# Patient Record
Sex: Female | Born: 1980
Health system: Southern US, Community
[De-identification: ages and names within clinical notes are randomized; demographics above are authoritative.]

## PROBLEM LIST (undated history)

## (undated) DIAGNOSIS — L409 Psoriasis, unspecified: Secondary | ICD-10-CM

## (undated) DIAGNOSIS — Z8679 Personal history of other diseases of the circulatory system: Secondary | ICD-10-CM

## (undated) DIAGNOSIS — N946 Dysmenorrhea, unspecified: Secondary | ICD-10-CM

## (undated) DIAGNOSIS — J302 Other seasonal allergic rhinitis: Secondary | ICD-10-CM

## (undated) HISTORY — PX: DENTAL SURGERY: SHX609

## (undated) HISTORY — DX: Other seasonal allergic rhinitis: J30.2

## (undated) HISTORY — PX: CHOLECYSTECTOMY: SHX55

## (undated) HISTORY — DX: Personal history of other diseases of the circulatory system: Z86.79

## (undated) HISTORY — PX: LAPAROSCOPY: SHX197

## (undated) HISTORY — DX: Dysmenorrhea, unspecified: N94.6

## (undated) HISTORY — PX: TONSILLECTOMY: SUR1361

## (undated) HISTORY — DX: Psoriasis, unspecified: L40.9

---

## 2005-04-08 ENCOUNTER — Emergency Department: Payer: Self-pay | Admitting: Emergency Medicine

## 2006-06-15 ENCOUNTER — Ambulatory Visit: Payer: Self-pay | Admitting: Obstetrics and Gynecology

## 2006-09-26 ENCOUNTER — Ambulatory Visit: Payer: Self-pay | Admitting: General Practice

## 2006-10-27 ENCOUNTER — Ambulatory Visit: Payer: Self-pay | Admitting: General Surgery

## 2006-11-07 ENCOUNTER — Ambulatory Visit: Payer: Self-pay | Admitting: General Surgery

## 2006-11-09 ENCOUNTER — Observation Stay: Payer: Self-pay | Admitting: General Surgery

## 2007-09-03 ENCOUNTER — Encounter: Payer: Self-pay | Admitting: Maternal and Fetal Medicine

## 2007-09-10 ENCOUNTER — Encounter: Payer: Self-pay | Admitting: Maternal and Fetal Medicine

## 2007-10-12 ENCOUNTER — Observation Stay: Payer: Self-pay

## 2007-10-19 ENCOUNTER — Ambulatory Visit: Payer: Self-pay | Admitting: Obstetrics and Gynecology

## 2007-11-11 ENCOUNTER — Observation Stay: Payer: Self-pay | Admitting: Obstetrics and Gynecology

## 2007-12-05 ENCOUNTER — Observation Stay: Payer: Self-pay

## 2007-12-14 ENCOUNTER — Ambulatory Visit: Payer: Self-pay | Admitting: Unknown Physician Specialty

## 2007-12-24 ENCOUNTER — Observation Stay: Payer: Self-pay

## 2007-12-27 HISTORY — PX: TUBAL LIGATION: SHX77

## 2007-12-28 ENCOUNTER — Observation Stay: Payer: Self-pay | Admitting: Obstetrics & Gynecology

## 2007-12-30 ENCOUNTER — Observation Stay: Payer: Self-pay

## 2008-01-21 ENCOUNTER — Inpatient Hospital Stay: Payer: Self-pay

## 2013-02-28 ENCOUNTER — Emergency Department: Payer: Self-pay | Admitting: Emergency Medicine

## 2015-12-14 ENCOUNTER — Other Ambulatory Visit: Payer: Self-pay | Admitting: Internal Medicine

## 2015-12-14 DIAGNOSIS — M5489 Other dorsalgia: Secondary | ICD-10-CM

## 2016-01-01 ENCOUNTER — Ambulatory Visit: Admission: RE | Admit: 2016-01-01 | Payer: Self-pay | Source: Ambulatory Visit

## 2016-02-18 ENCOUNTER — Other Ambulatory Visit: Payer: Self-pay | Admitting: Internal Medicine

## 2016-02-18 DIAGNOSIS — M549 Dorsalgia, unspecified: Secondary | ICD-10-CM

## 2016-03-09 ENCOUNTER — Ambulatory Visit: Payer: Commercial Managed Care - HMO | Attending: Internal Medicine

## 2016-05-24 ENCOUNTER — Encounter: Payer: Self-pay | Admitting: Medical Oncology

## 2016-05-24 ENCOUNTER — Emergency Department
Admission: EM | Admit: 2016-05-24 | Discharge: 2016-05-24 | Disposition: A | Discharge status: Home / Self Care | Payer: Commercial Managed Care - HMO | Attending: Emergency Medicine | Admitting: Emergency Medicine

## 2016-05-24 ENCOUNTER — Emergency Department: Payer: Commercial Managed Care - HMO

## 2016-05-24 DIAGNOSIS — Y9389 Activity, other specified: Secondary | ICD-10-CM | POA: Insufficient documentation

## 2016-05-24 DIAGNOSIS — S161XXA Strain of muscle, fascia and tendon at neck level, initial encounter: Secondary | ICD-10-CM | POA: Diagnosis not present

## 2016-05-24 DIAGNOSIS — S29012A Strain of muscle and tendon of back wall of thorax, initial encounter: Secondary | ICD-10-CM | POA: Diagnosis not present

## 2016-05-24 DIAGNOSIS — Y999 Unspecified external cause status: Secondary | ICD-10-CM | POA: Diagnosis not present

## 2016-05-24 DIAGNOSIS — Y92410 Unspecified street and highway as the place of occurrence of the external cause: Secondary | ICD-10-CM | POA: Diagnosis not present

## 2016-05-24 DIAGNOSIS — S46911A Strain of unspecified muscle, fascia and tendon at shoulder and upper arm level, right arm, initial encounter: Secondary | ICD-10-CM | POA: Diagnosis not present

## 2016-05-24 DIAGNOSIS — S39012A Strain of muscle, fascia and tendon of lower back, initial encounter: Secondary | ICD-10-CM

## 2016-05-24 DIAGNOSIS — S199XXA Unspecified injury of neck, initial encounter: Secondary | ICD-10-CM | POA: Diagnosis present

## 2016-05-24 MED ORDER — KETOROLAC TROMETHAMINE 60 MG/2ML IM SOLN
60.0000 mg | Freq: Once | INTRAMUSCULAR | Status: AC
  Administered 2016-05-24: 60 mg via INTRAMUSCULAR

## 2016-05-24 MED ORDER — IBUPROFEN 800 MG PO TABS: 800.0000 mg | ORAL_TABLET | Freq: Three times a day (TID) | ORAL | Status: DC | PRN

## 2016-05-24 MED ORDER — CYCLOBENZAPRINE HCL 10 MG PO TABS
ORAL_TABLET | ORAL | Status: AC
  Filled 2016-05-24: qty 1

## 2016-05-24 MED ORDER — KETOROLAC TROMETHAMINE 30 MG/ML IJ SOLN
INTRAMUSCULAR | Status: AC
  Filled 2016-05-24: qty 1

## 2016-05-24 MED ORDER — CYCLOBENZAPRINE HCL 10 MG PO TABS: 10.0000 mg | ORAL_TABLET | Freq: Three times a day (TID) | ORAL | Status: DC | PRN

## 2016-05-24 MED ORDER — CYCLOBENZAPRINE HCL 10 MG PO TABS
5.0000 mg | ORAL_TABLET | Freq: Once | ORAL | Status: AC
  Administered 2016-05-24: 5 mg via ORAL
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
## 2016-05-24 NOTE — Discharge Instructions (Signed)
Cervical Sprain °A cervical sprain is an injury in the neck in which the strong, fibrous tissues (ligaments) that connect your neck bones stretch or tear. Cervical sprains can range from mild to severe. Severe cervical sprains can cause the neck vertebrae to be unstable. This can lead to damage of the spinal cord and can result in serious nervous system problems. The amount of time it takes for a cervical sprain to get better depends on the cause and extent of the injury. Most cervical sprains heal in 1 to 3 weeks. °CAUSES  °Severe cervical sprains may be caused by:  °· Contact sport injuries (such as from football, rugby, wrestling, hockey, auto racing, gymnastics, diving, martial arts, or boxing).   °· Motor vehicle collisions.   °· Whiplash injuries. This is an injury from a sudden forward and backward whipping movement of the head and neck.  °· Falls.   °Mild cervical sprains may be caused by:  °· Being in an awkward position, such as while cradling a telephone between your ear and shoulder.   °· Sitting in a chair that does not offer proper support.   °· Working at a poorly designed computer station.   °· Looking up or down for long periods of time.   °SYMPTOMS  °· Pain, soreness, stiffness, or a burning sensation in the front, back, or sides of the neck. This discomfort may develop immediately after the injury or slowly, 24 hours or more after the injury.   °· Pain or tenderness directly in the middle of the back of the neck.   °· Shoulder or upper back pain.   °· Limited ability to move the neck.   °· Headache.   °· Dizziness.   °· Weakness, numbness, or tingling in the hands or arms.   °· Muscle spasms.   °· Difficulty swallowing or chewing.   °· Tenderness and swelling of the neck.   °DIAGNOSIS  °Most of the time your health care provider can diagnose a cervical sprain by taking your history and doing a physical exam. Your health care provider will ask about previous neck injuries and any known neck  problems, such as arthritis in the neck. X-rays may be taken to find out if there are any other problems, such as with the bones of the neck. Other tests, such as a CT scan or MRI, may also be needed.  °TREATMENT  °Treatment depends on the severity of the cervical sprain. Mild sprains can be treated with rest, keeping the neck in place (immobilization), and pain medicines. Severe cervical sprains are immediately immobilized. Further treatment is done to help with pain, muscle spasms, and other symptoms and may include: °· Medicines, such as pain relievers, numbing medicines, or muscle relaxants.   °· Physical therapy. This may involve stretching exercises, strengthening exercises, and posture training. Exercises and improved posture can help stabilize the neck, strengthen muscles, and help stop symptoms from returning.   °HOME CARE INSTRUCTIONS  °· Put ice on the injured area.   °· Put ice in a plastic bag.   °· Place a towel between your skin and the bag.   °· Leave the ice on for 15-20 minutes, 3-4 times a day.   °· If your injury was severe, you may have been given a cervical collar to wear. A cervical collar is a two-piece collar designed to keep your neck from moving while it heals. °· Do not remove the collar unless instructed by your health care provider. °· If you have long hair, keep it outside of the collar. °· Ask your health care provider before making any adjustments to your collar. Minor   adjustments may be required over time to improve comfort and reduce pressure on your chin or on the back of your head. °· If you are allowed to remove the collar for cleaning or bathing, follow your health care provider's instructions on how to do so safely. °· Keep your collar clean by wiping it with mild soap and water and drying it completely. If the collar you have been given includes removable pads, remove them every 1-2 days and hand wash them with soap and water. Allow them to air dry. They should be completely  dry before you wear them in the collar. °· If you are allowed to remove the collar for cleaning and bathing, wash and dry the skin of your neck. Check your skin for irritation or sores. If you see any, tell your health care provider. °· Do not drive while wearing the collar.   °· Only take over-the-counter or prescription medicines for pain, discomfort, or fever as directed by your health care provider.   °· Keep all follow-up appointments as directed by your health care provider.   °· Keep all physical therapy appointments as directed by your health care provider.   °· Make any needed adjustments to your workstation to promote good posture.   °· Avoid positions and activities that make your symptoms worse.   °· Warm up and stretch before being active to help prevent problems.   °SEEK MEDICAL CARE IF:  °· Your pain is not controlled with medicine.   °· You are unable to decrease your pain medicine over time as planned.   °· Your activity level is not improving as expected.   °SEEK IMMEDIATE MEDICAL CARE IF:  °· You develop any bleeding. °· You develop stomach upset. °· You have signs of an allergic reaction to your medicine.   °· Your symptoms get worse.   °· You develop new, unexplained symptoms.   °· You have numbness, tingling, weakness, or paralysis in any part of your body.   °MAKE SURE YOU:  °· Understand these instructions. °· Will watch your condition. °· Will get help right away if you are not doing well or get worse. °  °This information is not intended to replace advice given to you by your health care provider. Make sure you discuss any questions you have with your health care provider. °  °Document Released: 10/09/2007 Document Revised: 12/17/2013 Document Reviewed: 06/19/2013 °Elsevier Interactive Patient Education ©2016 [employer] ° °Lumbosacral Strain °Lumbosacral strain is a strain of any of the parts that make up your lumbosacral vertebrae. Your lumbosacral vertebrae are the bones that make up  the lower third of your backbone. Your lumbosacral vertebrae are held together by muscles and tough, fibrous tissue (ligaments).  °CAUSES  °A sudden blow to your back can cause lumbosacral strain. Also, anything that causes an excessive stretch of the muscles in the low back can cause this strain. This is typically seen when people exert themselves strenuously, fall, lift heavy objects, bend, or crouch repeatedly. °RISK FACTORS °· Physically demanding work. °· Participation in pushing or pulling sports or sports that require a sudden twist of the back (tennis, golf, baseball). °· Weight lifting. °· Excessive lower back curvature. °· Forward-tilted pelvis. °· Weak back or abdominal muscles or both. °· Tight hamstrings. °SIGNS AND SYMPTOMS  °Lumbosacral strain may cause pain in the area of your injury or pain that moves (radiates) down your leg.  °DIAGNOSIS °Your health care provider can often diagnose lumbosacral strain through a physical exam. In some cases, you may need tests such as X-ray exams.  °TREATMENT  °  Treatment for your lower back injury depends on many factors that your clinician will have to evaluate. However, most treatment will include the use of anti-inflammatory medicines. °HOME CARE INSTRUCTIONS  °· Avoid hard physical activities (tennis, racquetball, waterskiing) if you are not in proper physical condition for it. This may aggravate or create problems. °· If you have a back problem, avoid sports requiring sudden body movements. Swimming and walking are generally safer activities. °· Maintain good posture. °· Maintain a healthy weight. °· For acute conditions, you may put ice on the injured area. °· Put ice in a plastic bag. °· Place a towel between your skin and the bag. °· Leave the ice on for 20 minutes, 2-3 times a day. °· When the low back starts healing, stretching and strengthening exercises may be recommended. °SEEK MEDICAL CARE IF: °· Your back pain is getting worse. °· You experience  severe back pain not relieved with medicines. °SEEK IMMEDIATE MEDICAL CARE IF:  °· You have numbness, tingling, weakness, or problems with the use of your arms or legs. °· There is a change in bowel or bladder control. °· You have increasing pain in any area of the body, including your belly (abdomen). °· You notice shortness of breath, dizziness, or feel faint. °· You feel sick to your stomach (nauseous), are throwing up (vomiting), or become sweaty. °· You notice discoloration of your toes or legs, or your feet get very cold. °MAKE SURE YOU:  °· Understand these instructions. °· Will watch your condition. °· Will get help right away if you are not doing well or get worse. °  °This information is not intended to replace advice given to you by your health care provider. Make sure you discuss any questions you have with your health care provider. °  °Document Released: 09/21/2005 Document Revised: 01/02/2015 Document Reviewed: 07/31/2013 °Elsevier Interactive Patient Education ©2016 [employer] Inc[employer] ° °Motor Vehicle Collision °It is common to have multiple bruises and sore muscles after a motor vehicle collision (MVC). These tend to feel worse for the first 24 hours. You may have the most stiffness and soreness over the first several hours. You may also feel worse when you wake up the first morning after your collision. After this point, you will usually begin to improve with each day. The speed of improvement often depends on the severity of the collision, the number of injuries, and the location and nature of these injuries. °HOME CARE INSTRUCTIONS °· Put ice on the injured area. °¨ Put ice in a plastic bag. °¨ Place a towel between your skin and the bag. °¨ Leave the ice on for 15-20 minutes, 3-4 times a day, or as directed by your health care provider. °· Drink enough fluids to keep your urine clear or pale yellow. Do not drink alcohol. °· Take a warm shower or bath once or twice a day. This will increase blood flow  to sore muscles. °· You may return to activities as directed by your caregiver. Be careful when lifting, as this may aggravate neck or back pain. °· Only take over-the-counter or prescription medicines for pain, discomfort, or fever as directed by your caregiver. Do not use aspirin. This may increase bruising and bleeding. °SEEK IMMEDIATE MEDICAL CARE IF: °· You have numbness, tingling, or weakness in the arms or legs. °· You develop severe headaches not relieved with medicine. °· You have severe neck pain, especially tenderness in the middle of the back of your neck. °· You have changes   in bowel or bladder control. °· There is increasing pain in any area of the body. °· You have shortness of breath, light-headedness, dizziness, or fainting. °· You have chest pain. °· You feel sick to your stomach (nauseous), throw up (vomit), or sweat. °· You have increasing abdominal discomfort. °· There is blood in your urine, stool, or vomit. °· You have pain in your shoulder (shoulder strap areas). °· You feel your symptoms are getting worse. °MAKE SURE YOU: °· Understand these instructions. °· Will watch your condition. °· Will get help right away if you are not doing well or get worse. °  °This information is not intended to replace advice given to you by your health care provider. Make sure you discuss any questions you have with your health care provider. °  °Document Released: 12/12/2005 Document Revised: 01/02/2015 Document Reviewed: 05/11/2011 °Elsevier Interactive Patient Education ©2016 [employer]vier Inc. ° °

## 2016-06-03 ENCOUNTER — Emergency Department: Payer: Commercial Managed Care - HMO

## 2016-06-03 ENCOUNTER — Encounter: Payer: Self-pay | Admitting: Emergency Medicine

## 2016-06-03 ENCOUNTER — Emergency Department
Admission: EM | Admit: 2016-06-03 | Discharge: 2016-06-03 | Disposition: A | Discharge status: Home / Self Care | Payer: Commercial Managed Care - HMO | Attending: Emergency Medicine | Admitting: Emergency Medicine

## 2016-06-03 DIAGNOSIS — M545 Low back pain, unspecified: Secondary | ICD-10-CM

## 2016-06-03 DIAGNOSIS — Y9389 Activity, other specified: Secondary | ICD-10-CM | POA: Diagnosis not present

## 2016-06-03 DIAGNOSIS — Z87891 Personal history of nicotine dependence: Secondary | ICD-10-CM | POA: Diagnosis not present

## 2016-06-03 DIAGNOSIS — Y92410 Unspecified street and highway as the place of occurrence of the external cause: Secondary | ICD-10-CM | POA: Diagnosis not present

## 2016-06-03 DIAGNOSIS — Y999 Unspecified external cause status: Secondary | ICD-10-CM | POA: Diagnosis not present

## 2016-06-03 MED ORDER — TRAMADOL HCL 50 MG PO TABS: 50.0000 mg | ORAL_TABLET | Freq: Four times a day (QID) | ORAL | Status: DC | PRN

## 2016-06-03 MED ORDER — KETOROLAC TROMETHAMINE 60 MG/2ML IM SOLN
60.0000 mg | Freq: Once | INTRAMUSCULAR | Status: AC
  Administered 2016-06-03: 60 mg via INTRAMUSCULAR
  Filled 2016-06-03: qty 2

## 2016-06-03 MED ORDER — DIAZEPAM 5 MG/ML IJ SOLN
5.0000 mg | Freq: Once | INTRAMUSCULAR | Status: AC
  Administered 2016-06-03: 5 mg via INTRAMUSCULAR
  Filled 2016-06-03: qty 2

## 2016-06-03 MED ORDER — HYDROCODONE-ACETAMINOPHEN 5-325 MG PO TABS
1.0000 | ORAL_TABLET | Freq: Once | ORAL | Status: AC
  Administered 2016-06-03: 1 via ORAL
  Filled 2016-06-03: qty 1

## 2016-06-03 MED ORDER — MELOXICAM 15 MG PO TABS: 15.0000 mg | ORAL_TABLET | Freq: Every day | ORAL | Status: DC

## 2016-06-03 MED ORDER — DIAZEPAM 2 MG PO TABS: 2.0000 mg | ORAL_TABLET | Freq: Three times a day (TID) | ORAL | Status: DC | PRN
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
## 2016-06-03 NOTE — Discharge Instructions (Signed)
Back Pain, Adult °Back pain is very common in adults. The cause of back pain is rarely dangerous and the pain often gets better over time. The cause of your back pain may not be known. Some common causes of back pain include: °1. Strain of the muscles or ligaments supporting the spine. °2. Wear and tear (degeneration) of the spinal disks. °3. Arthritis. °4. Direct injury to the back. °For many people, back pain may return. Since back pain is rarely dangerous, most people can learn to manage this condition on their own. °HOME CARE INSTRUCTIONS °Watch your back pain for any changes. The following actions may help to lessen any discomfort you are feeling: °1. Remain active. It is stressful on your back to sit or stand in one place for long periods of time. Do not sit, drive, or stand in one place for more than 30 minutes at a time. Take short walks on even surfaces as soon as you are able. Try to increase the length of time you walk each day. °2. Exercise regularly as directed by your health care provider. Exercise helps your back heal faster. It also helps avoid future injury by keeping your muscles strong and flexible. °3. Do not stay in bed. Resting more than 1-2 days can delay your recovery. °4. Pay attention to your body when you bend and lift. The most comfortable positions are those that put less stress on your recovering back. Always use proper lifting techniques, including: °1. Bending your knees. °2. Keeping the load close to your body. °3. Avoiding twisting. °5. Find a comfortable position to sleep. Use a firm mattress and lie on your side with your knees slightly bent. If you lie on your back, put a pillow under your knees. °6. Avoid feeling anxious or stressed. Stress increases muscle tension and can worsen back pain. It is important to recognize when you are anxious or stressed and learn ways to manage it, such as with exercise. °7. Take medicines only as directed by your health care provider.  Over-the-counter medicines to reduce pain and inflammation are often the most helpful. Your health care provider may prescribe muscle relaxant drugs. These medicines help dull your pain so you can more quickly return to your normal activities and healthy exercise. °8. Apply ice to the injured area: °1. Put ice in a plastic bag. °2. Place a towel between your skin and the bag. °3. Leave the ice on for 20 minutes, 2-3 times a day for the first 2-3 days. After that, ice and heat may be alternated to reduce pain and spasms. °9. Maintain a healthy weight. Excess weight puts extra stress on your back and makes it difficult to maintain good posture. °SEEK MEDICAL CARE IF: °1. You have pain that is not relieved with rest or medicine. °2. You have increasing pain going down into the legs or buttocks. °3. You have pain that does not improve in one week. °4. You have night pain. °5. You lose weight. °6. You have a fever or chills. °SEEK IMMEDIATE MEDICAL CARE IF:  °1. You develop new bowel or bladder control problems. °2. You have unusual weakness or numbness in your arms or legs. °3. You develop nausea or vomiting. °4. You develop abdominal pain. °5. You feel faint. °  °This information is not intended to replace advice given to you by your health care provider. Make sure you discuss any questions you have with your health care provider. °  °Document Released: 12/12/2005 Document Revised: 01/02/2015 Document Reviewed: 04/15/2014 °Elsevier Interactive Patient Education ©2016 [employer] ° °Back Exercises °The following exercises strengthen the muscles that help to support the back. They also help to keep the lower back flexible. Doing these exercises can help to prevent back pain or lessen existing pain. °If you have back pain or discomfort, try doing these exercises 2-3 times each day or as told by your health care provider. When the pain goes away, do them once each day, but increase the number of times that you repeat the  steps for each exercise (do more repetitions). If you do not have back pain or discomfort, do these exercises once each day or as told by your health care provider. °EXERCISES °Single Knee to Chest °Repeat these steps 3-5 times for each leg: °5. Lie on your back on a firm bed or the floor with your legs extended. °6. Bring one knee to your chest. Your other leg should stay extended and in contact with the floor. °7. Hold your knee in place by grabbing your knee or thigh. °8. Pull on your knee until you feel a gentle stretch in your lower back. °9. Hold the stretch for 10-30 seconds. °10. Slowly release and straighten your leg. °Pelvic Tilt °Repeat these steps 5-10 times: °10. Lie on your back on a firm bed or the floor with your legs extended. °11. Bend your knees so they are pointing toward the ceiling and your feet are flat on the floor. °12. Tighten your lower abdominal muscles to press your lower back against the floor. This motion will tilt your pelvis so your tailbone points up toward the ceiling instead of pointing to your feet or the floor. °13. With gentle tension and even breathing, hold this position for 5-10 seconds. °Cat-Cow °Repeat these steps until your lower back becomes more flexible: °7. Get into a hands-and-knees position on a firm surface. Keep your hands under your shoulders, and keep your knees under your hips. You may place padding under your knees for comfort. °8. Let your head hang down, and point your tailbone toward the floor so your lower back becomes rounded like the back of a cat. °9. Hold this position for 5 seconds. °10. Slowly lift your head and point your tailbone up toward the ceiling so your back forms a sagging arch like the back of a cow. °11. Hold this position for 5 seconds. °Press-Ups °Repeat these steps 5-10 times: °6. Lie on your abdomen (face-down) on the floor. °7. Place your palms near your head, about shoulder-width apart. °8. While you keep your back as relaxed as  possible and keep your hips on the floor, slowly straighten your arms to raise the top half of your body and lift your shoulders. Do not use your back muscles to raise your upper torso. You may adjust the placement of your hands to make yourself more comfortable. °9. Hold this position for 5 seconds while you keep your back relaxed. °10. Slowly return to lying flat on the floor. °Bridges °Repeat these steps 10 times: °1. Lie on your back on a firm surface. °2. Bend your knees so they are pointing toward the ceiling and your feet are flat on the floor. °3. Tighten your buttocks muscles and lift your buttocks off of the floor until your waist is at almost the same height as your knees. You should feel the muscles working in your buttocks and the back of your thighs. If you do not feel these muscles, slide your feet 1-2 inches farther away from your buttocks. °4. Hold this   position for 3-5 seconds. °5. Slowly lower your hips to the starting position, and allow your buttocks muscles to relax completely. °If this exercise is too easy, try doing it with your arms crossed over your chest. °Abdominal Crunches °Repeat these steps 5-10 times: °1. Lie on your back on a firm bed or the floor with your legs extended. °2. Bend your knees so they are pointing toward the ceiling and your feet are flat on the floor. °3. Cross your arms over your chest. °4. Tip your chin slightly toward your chest without bending your neck. °5. Tighten your abdominal muscles and slowly raise your trunk (torso) high enough to lift your shoulder blades a tiny bit off of the floor. Avoid raising your torso higher than that, because it can put too much stress on your low back and it does not help to strengthen your abdominal muscles. °6. Slowly return to your starting position. °Back Lifts °Repeat these steps 5-10 times: °1. Lie on your abdomen (face-down) with your arms at your sides, and rest your forehead on the floor. °2. Tighten the muscles in your  legs and your buttocks. °3. Slowly lift your chest off of the floor while you keep your hips pressed to the floor. Keep the back of your head in line with the curve in your back. Your eyes should be looking at the floor. °4. Hold this position for 3-5 seconds. °5. Slowly return to your starting position. °SEEK MEDICAL CARE IF: °· Your back pain or discomfort gets much worse when you do an exercise. °· Your back pain or discomfort does not lessen within 2 hours after you exercise. °If you have any of these problems, stop doing these exercises right away. Do not do them again unless your health care provider says that you can. °SEEK IMMEDIATE MEDICAL CARE IF: °· You develop sudden, severe back pain. If this happens, stop doing the exercises right away. Do not do them again unless your health care provider says that you can. °  °This information is not intended to replace advice given to you by your health care provider. Make sure you discuss any questions you have with your health care provider. °  °Document Released: 01/19/2005 Document Revised: 09/02/2015 Document Reviewed: 02/05/2015 °Elsevier Interactive Patient Education ©2016 [employer] ° °
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
## 2016-12-30 DIAGNOSIS — R21 Rash and other nonspecific skin eruption: Secondary | ICD-10-CM | POA: Diagnosis not present

## 2016-12-30 DIAGNOSIS — G43909 Migraine, unspecified, not intractable, without status migrainosus: Secondary | ICD-10-CM | POA: Diagnosis not present

## 2017-01-06 ENCOUNTER — Ambulatory Visit
Admission: RE | Admit: 2017-01-06 | Discharge: 2017-01-06 | Disposition: A | Discharge status: Home / Self Care | Payer: Commercial Managed Care - HMO | Source: Ambulatory Visit | Attending: Cardiology | Admitting: Cardiology

## 2017-01-06 ENCOUNTER — Ambulatory Visit
Admission: RE | Admit: 2017-01-06 | Discharge: 2017-01-06 | Disposition: A | Discharge status: Home / Self Care | Payer: Commercial Managed Care - HMO | Source: Ambulatory Visit | Attending: Internal Medicine | Admitting: Internal Medicine

## 2017-01-06 ENCOUNTER — Other Ambulatory Visit: Payer: Self-pay | Admitting: Cardiology

## 2017-01-06 DIAGNOSIS — R05 Cough: Secondary | ICD-10-CM

## 2017-01-06 DIAGNOSIS — R0989 Other specified symptoms and signs involving the circulatory and respiratory systems: Secondary | ICD-10-CM | POA: Diagnosis present

## 2017-01-06 DIAGNOSIS — R059 Cough, unspecified: Secondary | ICD-10-CM

## 2017-01-09 DIAGNOSIS — H6693 Otitis media, unspecified, bilateral: Secondary | ICD-10-CM | POA: Diagnosis not present

## 2017-01-09 DIAGNOSIS — G43909 Migraine, unspecified, not intractable, without status migrainosus: Secondary | ICD-10-CM | POA: Diagnosis not present

## 2017-02-09 DIAGNOSIS — J3081 Allergic rhinitis due to animal (cat) (dog) hair and dander: Secondary | ICD-10-CM | POA: Diagnosis not present

## 2017-02-09 DIAGNOSIS — J301 Allergic rhinitis due to pollen: Secondary | ICD-10-CM | POA: Diagnosis not present

## 2017-02-09 DIAGNOSIS — R21 Rash and other nonspecific skin eruption: Secondary | ICD-10-CM | POA: Diagnosis not present

## 2017-02-09 DIAGNOSIS — J3089 Other allergic rhinitis: Secondary | ICD-10-CM | POA: Diagnosis not present

## 2017-02-14 DIAGNOSIS — J301 Allergic rhinitis due to pollen: Secondary | ICD-10-CM | POA: Diagnosis not present

## 2017-02-15 DIAGNOSIS — J3081 Allergic rhinitis due to animal (cat) (dog) hair and dander: Secondary | ICD-10-CM | POA: Diagnosis not present

## 2017-02-15 DIAGNOSIS — J3089 Other allergic rhinitis: Secondary | ICD-10-CM | POA: Diagnosis not present

## 2017-02-24 DIAGNOSIS — J301 Allergic rhinitis due to pollen: Secondary | ICD-10-CM | POA: Diagnosis not present

## 2017-02-24 DIAGNOSIS — J3081 Allergic rhinitis due to animal (cat) (dog) hair and dander: Secondary | ICD-10-CM | POA: Diagnosis not present

## 2017-02-24 DIAGNOSIS — J3089 Other allergic rhinitis: Secondary | ICD-10-CM | POA: Diagnosis not present

## 2017-03-03 DIAGNOSIS — J301 Allergic rhinitis due to pollen: Secondary | ICD-10-CM | POA: Diagnosis not present

## 2017-03-03 DIAGNOSIS — J3089 Other allergic rhinitis: Secondary | ICD-10-CM | POA: Diagnosis not present

## 2017-03-03 DIAGNOSIS — J3081 Allergic rhinitis due to animal (cat) (dog) hair and dander: Secondary | ICD-10-CM | POA: Diagnosis not present

## 2017-03-10 DIAGNOSIS — J301 Allergic rhinitis due to pollen: Secondary | ICD-10-CM | POA: Diagnosis not present

## 2017-03-10 DIAGNOSIS — J3081 Allergic rhinitis due to animal (cat) (dog) hair and dander: Secondary | ICD-10-CM | POA: Diagnosis not present

## 2017-03-10 DIAGNOSIS — J3089 Other allergic rhinitis: Secondary | ICD-10-CM | POA: Diagnosis not present

## 2017-03-23 DIAGNOSIS — J3089 Other allergic rhinitis: Secondary | ICD-10-CM | POA: Diagnosis not present

## 2017-03-23 DIAGNOSIS — J3081 Allergic rhinitis due to animal (cat) (dog) hair and dander: Secondary | ICD-10-CM | POA: Diagnosis not present

## 2017-03-23 DIAGNOSIS — J301 Allergic rhinitis due to pollen: Secondary | ICD-10-CM | POA: Diagnosis not present

## 2017-03-31 DIAGNOSIS — R5381 Other malaise: Secondary | ICD-10-CM | POA: Diagnosis not present

## 2017-03-31 DIAGNOSIS — J301 Allergic rhinitis due to pollen: Secondary | ICD-10-CM | POA: Diagnosis not present

## 2017-03-31 DIAGNOSIS — J3089 Other allergic rhinitis: Secondary | ICD-10-CM | POA: Diagnosis not present

## 2017-03-31 DIAGNOSIS — Z888 Allergy status to other drugs, medicaments and biological substances status: Secondary | ICD-10-CM | POA: Diagnosis not present

## 2017-03-31 DIAGNOSIS — J3081 Allergic rhinitis due to animal (cat) (dog) hair and dander: Secondary | ICD-10-CM | POA: Diagnosis not present

## 2017-03-31 DIAGNOSIS — G43909 Migraine, unspecified, not intractable, without status migrainosus: Secondary | ICD-10-CM | POA: Diagnosis not present

## 2017-04-05 ENCOUNTER — Ambulatory Visit (INDEPENDENT_AMBULATORY_CARE_PROVIDER_SITE_OTHER): Payer: Commercial Managed Care - HMO | Admitting: Obstetrics and Gynecology

## 2017-04-05 ENCOUNTER — Encounter: Payer: Self-pay | Admitting: Obstetrics and Gynecology

## 2017-04-05 VITALS — BP 130/90 | HR 60 | Ht 64.0 in | Wt 180.0 lb

## 2017-04-05 DIAGNOSIS — N946 Dysmenorrhea, unspecified: Secondary | ICD-10-CM | POA: Diagnosis not present

## 2017-04-05 DIAGNOSIS — I10 Essential (primary) hypertension: Secondary | ICD-10-CM | POA: Insufficient documentation

## 2017-04-05 DIAGNOSIS — J302 Other seasonal allergic rhinitis: Secondary | ICD-10-CM | POA: Insufficient documentation

## 2017-04-05 DIAGNOSIS — Z01419 Encounter for gynecological examination (general) (routine) without abnormal findings: Secondary | ICD-10-CM

## 2017-04-05 NOTE — Progress Notes (Signed)
Chief Complaint  Patient presents with  . Gynecologic Exam     HPI:      [name] is a 36 y.o. [id number] who LMP was Patient's last menstrual period was 03/27/2017., presents today for her annual examination.  Her menses are regular every 28-30 days, lasting 4 days.  Dysmenorrhea moderate, occurring first 1-2 days of flowShe does not have intermenstrual bleeding. She is s/p endometrial ablation due to menorrhagia. Bleeding is improved but cramping is still bad. Midol continuously is helpful with sx. She tried OCPs 2016 with sx relief and would like to restart but she is now being treated for HTN.   Sex activity: single partner, contraception - tubal ligation.  Last Pap: February 06, 2015  Results were: no abnormalities /neg HPV DNA  Hx of STDs: none   There is no FH of breast cancer. There is no FH of ovarian cancer. The patient does do self-breast exams.  Tobacco use: The patient denies current or previous tobacco use. Alcohol use: none Exercise: not active  She does get adequate calcium and Vitamin D in her diet.  Patient Active Problem List   Diagnosis Date Noted  . Hypertension 04/05/2017  . Seasonal allergies 04/05/2017      Past Surgical History:  Procedure Laterality Date  . CESAREAN SECTION  02/05/2005  . CESAREAN SECTION  01/04/2008  . CHOLECYSTECTOMY    . LAPAROSCOPY    . TONSILLECTOMY      History reviewed. No pertinent family history.  Social History   Social History  . Marital status: Married    Spouse name: N/A  . Number of children: N/A  . Years of education: N/A   Occupational History  . Not on file.   Social History Main Topics  . Smoking status: Former Games developer  . Smokeless tobacco: Never Used  . Alcohol use Yes     Comment: light  . Drug use: No  . Sexual activity: Yes    Birth control/ protection: Pill   Other Topics Concern  . Not on file   Social History Narrative  . No narrative on file     Current Outpatient  Prescriptions:  .  amLODipine (NORVASC) 5 MG tablet, Take 5 mg by mouth daily., Disp: , Rfl:  .  EPINEPHrine 0.3 mg/0.3 mL IJ SOAJ injection, Inject 0.3 mg into the muscle as needed., Disp: , Rfl:  .  fluticasone (FLONASE) 50 MCG/ACT nasal spray, Place 50 sprays into both nostrils daily., Disp: , Rfl:  .  levocetirizine (XYZAL) 5 MG tablet, Take 5 mg by mouth daily., Disp: , Rfl:  .  zolpidem (AMBIEN) 10 MG tablet, Take 10 mg by mouth daily., Disp: , Rfl:   ROS:  Review of Systems  Constitutional: Negative for fever, malaise/fatigue and weight loss.  HENT: Negative for congestion, ear pain and sinus pain.   Respiratory: Negative for cough, shortness of breath and wheezing.   Cardiovascular: Negative for chest pain, orthopnea and leg swelling.  Gastrointestinal: Negative for constipation, diarrhea, nausea and vomiting.  Genitourinary: Negative for dysuria, frequency, hematuria and urgency.       Breast ROS: negative   Musculoskeletal: Negative for back pain, joint pain and myalgias.  Skin: Negative for itching and rash.  Neurological: Negative for dizziness, tingling, focal weakness and headaches.  Endo/Heme/Allergies: Negative for environmental allergies. Does not bruise/bleed easily.  Psychiatric/Behavioral: Negative for depression and suicidal ideas. The patient is not nervous/anxious and does not have insomnia.     Objective: BP  130/90 (Patient Position: Sitting)   Pulse 60   Ht  (1.626 m)   Wt 180 lb (81.6 kg)   LMP 03/27/2017   BMI 30.90 kg/m    Physical Exam  Constitutional: She is oriented to person, place, and time. She appears well-developed and well-nourished.  Genitourinary: Vagina normal and uterus normal. No erythema or tenderness in the vagina. No vaginal discharge found. Right adnexum does not display mass and does not display tenderness. Left adnexum does not display mass and does not display tenderness. Cervix does not exhibit motion tenderness or polyp.  Uterus is not enlarged or tender.  Neck: Normal range of motion. No thyromegaly present.  Cardiovascular: Normal rate, regular rhythm and normal heart sounds.   No murmur heard. Pulmonary/Chest: Effort normal and breath sounds normal. Right breast exhibits no mass, no nipple discharge, no skin change and no tenderness. Left breast exhibits no mass, no nipple discharge, no skin change and no tenderness.  Abdominal: Soft. There is no tenderness. There is no guarding.  Musculoskeletal: Normal range of motion.  Neurological: She is alert and oriented to person, place, and time. No cranial nerve deficit.  Psychiatric: She has a normal mood and affect. Her behavior is normal.  Vitals reviewed.   Assessment/Plan: Encounter for annual routine gynecological examination  Dysmenorrhea - Pt used to be on OCPs but has HTN. Can try Aleve/motrin or Rx anaprox. Pt declines Rx for now. F/u prn.             GYN counsel mammography screening, adequate intake of calcium and vitamin D     F/U  Return in about 1 year (around 04/05/2018).  Fusae Florio B. Draiden Mirsky, PA-C 04/05/2017 9:37 AM

## 2017-04-07 DIAGNOSIS — G43909 Migraine, unspecified, not intractable, without status migrainosus: Secondary | ICD-10-CM | POA: Diagnosis not present

## 2017-04-07 DIAGNOSIS — Z888 Allergy status to other drugs, medicaments and biological substances status: Secondary | ICD-10-CM | POA: Diagnosis not present

## 2017-04-14 DIAGNOSIS — J301 Allergic rhinitis due to pollen: Secondary | ICD-10-CM | POA: Diagnosis not present

## 2017-04-14 DIAGNOSIS — J3089 Other allergic rhinitis: Secondary | ICD-10-CM | POA: Diagnosis not present

## 2017-04-14 DIAGNOSIS — J3081 Allergic rhinitis due to animal (cat) (dog) hair and dander: Secondary | ICD-10-CM | POA: Diagnosis not present

## 2017-04-28 DIAGNOSIS — J3089 Other allergic rhinitis: Secondary | ICD-10-CM | POA: Diagnosis not present

## 2017-04-28 DIAGNOSIS — J3081 Allergic rhinitis due to animal (cat) (dog) hair and dander: Secondary | ICD-10-CM | POA: Diagnosis not present

## 2017-04-28 DIAGNOSIS — J301 Allergic rhinitis due to pollen: Secondary | ICD-10-CM | POA: Diagnosis not present

## 2017-05-05 DIAGNOSIS — G43909 Migraine, unspecified, not intractable, without status migrainosus: Secondary | ICD-10-CM | POA: Diagnosis not present

## 2017-05-12 DIAGNOSIS — J3081 Allergic rhinitis due to animal (cat) (dog) hair and dander: Secondary | ICD-10-CM | POA: Diagnosis not present

## 2017-05-12 DIAGNOSIS — J301 Allergic rhinitis due to pollen: Secondary | ICD-10-CM | POA: Diagnosis not present

## 2017-05-12 DIAGNOSIS — J3089 Other allergic rhinitis: Secondary | ICD-10-CM | POA: Diagnosis not present

## 2017-05-19 DIAGNOSIS — J3089 Other allergic rhinitis: Secondary | ICD-10-CM | POA: Diagnosis not present

## 2017-05-19 DIAGNOSIS — J3081 Allergic rhinitis due to animal (cat) (dog) hair and dander: Secondary | ICD-10-CM | POA: Diagnosis not present

## 2017-05-19 DIAGNOSIS — J301 Allergic rhinitis due to pollen: Secondary | ICD-10-CM | POA: Diagnosis not present

## 2017-05-26 DIAGNOSIS — J301 Allergic rhinitis due to pollen: Secondary | ICD-10-CM | POA: Diagnosis not present

## 2017-05-26 DIAGNOSIS — J3081 Allergic rhinitis due to animal (cat) (dog) hair and dander: Secondary | ICD-10-CM | POA: Diagnosis not present

## 2017-05-26 DIAGNOSIS — J3089 Other allergic rhinitis: Secondary | ICD-10-CM | POA: Diagnosis not present

## 2017-06-02 DIAGNOSIS — J3081 Allergic rhinitis due to animal (cat) (dog) hair and dander: Secondary | ICD-10-CM | POA: Diagnosis not present

## 2017-06-02 DIAGNOSIS — J301 Allergic rhinitis due to pollen: Secondary | ICD-10-CM | POA: Diagnosis not present

## 2017-06-02 DIAGNOSIS — J3089 Other allergic rhinitis: Secondary | ICD-10-CM | POA: Diagnosis not present

## 2017-06-09 DIAGNOSIS — J3081 Allergic rhinitis due to animal (cat) (dog) hair and dander: Secondary | ICD-10-CM | POA: Diagnosis not present

## 2017-06-09 DIAGNOSIS — J301 Allergic rhinitis due to pollen: Secondary | ICD-10-CM | POA: Diagnosis not present

## 2017-06-09 DIAGNOSIS — J3089 Other allergic rhinitis: Secondary | ICD-10-CM | POA: Diagnosis not present

## 2017-06-16 DIAGNOSIS — J301 Allergic rhinitis due to pollen: Secondary | ICD-10-CM | POA: Diagnosis not present

## 2017-06-16 DIAGNOSIS — J3081 Allergic rhinitis due to animal (cat) (dog) hair and dander: Secondary | ICD-10-CM | POA: Diagnosis not present

## 2017-06-16 DIAGNOSIS — J3089 Other allergic rhinitis: Secondary | ICD-10-CM | POA: Diagnosis not present

## 2017-06-30 DIAGNOSIS — J301 Allergic rhinitis due to pollen: Secondary | ICD-10-CM | POA: Diagnosis not present

## 2017-06-30 DIAGNOSIS — J3081 Allergic rhinitis due to animal (cat) (dog) hair and dander: Secondary | ICD-10-CM | POA: Diagnosis not present

## 2017-06-30 DIAGNOSIS — J3089 Other allergic rhinitis: Secondary | ICD-10-CM | POA: Diagnosis not present

## 2017-07-21 ENCOUNTER — Ambulatory Visit (INDEPENDENT_AMBULATORY_CARE_PROVIDER_SITE_OTHER): Payer: BLUE CROSS/BLUE SHIELD | Admitting: Certified Nurse Midwife

## 2017-07-21 ENCOUNTER — Encounter: Payer: Self-pay | Admitting: Certified Nurse Midwife

## 2017-07-21 VITALS — BP 132/84 | HR 105 | Ht 64.0 in | Wt 174.0 lb

## 2017-07-21 DIAGNOSIS — Z1231 Encounter for screening mammogram for malignant neoplasm of breast: Secondary | ICD-10-CM | POA: Diagnosis not present

## 2017-07-21 DIAGNOSIS — Z1239 Encounter for other screening for malignant neoplasm of breast: Secondary | ICD-10-CM

## 2017-07-21 NOTE — Progress Notes (Signed)
Obstetrics & Gynecology Office Visit   Chief Complaint:  Chief Complaint  Patient presents with  . Breast Problem    lump in right breast    History of Present Illness: 36 year old [id number] female who presents with complaints of feeling a non tender mass in her right inner lower breast 4 days ago. She had her last annual and breast exam here with PA Copland 3 months ago. She usually does her self breast exams monthly. LMP 17 July. No past history of breast surgery. No family history of breast cancer on maternal side. Paternal history unknown. She reports wearing underwire bras. Current form of contraception: BTL Review of Systems:  Review of Systems  Constitutional: Negative for chills and fever.  Respiratory: Negative for shortness of breath.   Cardiovascular: Negative for chest pain.   Breasts: see HPI  Past Medical History:  Past Medical History:  Diagnosis Date  . Dysmenorrhea   . Essential hypertension   . Seasonal allergies     Past Surgical History:  Past Surgical History:  Procedure Laterality Date  . CESAREAN SECTION  02/05/2005  . CESAREAN SECTION  01/04/2008  . CHOLECYSTECTOMY    . LAPAROSCOPY    . TONSILLECTOMY    . TUBAL LIGATION  2009    Gynecologic History: Patient's last menstrual period was 07/11/2017 (exact date).  Obstetric History: [id number]  Family History:  Family History  Problem Relation Age of Onset  . Cancer Neg Hx   . Diabetes Neg Hx   . Hyperlipidemia Neg Hx   . Hypertension Neg Hx   . Stroke Neg Hx     Social History:  Social History   Social History  . Marital status: Married    Spouse name: N/A  . Number of children: 3  . Years of education: N/A   Occupational History  . Not on file.   Social History Main Topics  . Smoking status: Former Games developermoker  . Smokeless tobacco: Never Used  . Alcohol use Yes     Comment: light  . Drug use: No  . Sexual activity: Yes    Birth control/ protection: Surgical   Other Topics Concern   . Not on file   Social History Narrative  . No narrative on file    Allergies:  Allergies  Allergen Reactions  . Morphine And Related   . Pollen Extract Other (See Comments)    Nasal congestion    Medications: Prior to Admission medications   Medication Sig Start Date End Date Taking? Authorizing Provider  amLODipine (NORVASC) 5 MG tablet Take 5 mg by mouth daily. 03/11/17  Yes [provider]  EPINEPHrine 0.3 mg/0.3 mL IJ SOAJ injection Inject 0.3 mg into the muscle as needed. 02/13/17  Yes [provider]  levocetirizine (XYZAL) 5 MG tablet Take 5 mg by mouth daily. 02/13/17  Yes [provider]  zolpidem (AMBIEN) 10 MG tablet Take 10 mg by mouth daily. 03/27/17  Yes [provider]    Physical Exam Vitals:BP 132/84   Pulse (!) 105   Ht 5\' 4"  (1.626 m)   Wt 174 lb (78.9 kg)   LMP 07/11/2017 (Exact Date)   BMI 29.87 kg/m  Patient's last menstrual period was 07/11/2017 (exact date).  Physical Exam  Constitutional: She appears well-developed and well-nourished. No distress.  Respiratory:    Right breast: no dominant masses, no skin or nipple changes, no infraclavicular axillary lymphadenopathy, no nipple discharge Left breast: no dominant masses, no skin or  nipple changes, no infraclavicular or axially lymphadenopathy, no nipple discharge      Assessment: 36 y.o. [id number] with normal breast exam No dominant breast mass appreciated  Plan:  Screening mammogram ordered RTO for annual and prn  Farrel Connersolleen Zaria Taha, CNM

## 2017-08-04 ENCOUNTER — Ambulatory Visit
Admission: RE | Admit: 2017-08-04 | Discharge: 2017-08-04 | Disposition: A | Discharge status: Home / Self Care | Payer: Commercial Managed Care - HMO | Source: Ambulatory Visit | Attending: Certified Nurse Midwife | Admitting: Certified Nurse Midwife

## 2017-08-04 DIAGNOSIS — Z1231 Encounter for screening mammogram for malignant neoplasm of breast: Secondary | ICD-10-CM | POA: Insufficient documentation

## 2017-08-04 DIAGNOSIS — Z1239 Encounter for other screening for malignant neoplasm of breast: Secondary | ICD-10-CM

## 2017-09-22 DIAGNOSIS — J219 Acute bronchiolitis, unspecified: Secondary | ICD-10-CM | POA: Diagnosis not present

## 2017-09-22 DIAGNOSIS — H1012 Acute atopic conjunctivitis, left eye: Secondary | ICD-10-CM | POA: Diagnosis not present

## 2017-09-22 DIAGNOSIS — Z888 Allergy status to other drugs, medicaments and biological substances status: Secondary | ICD-10-CM | POA: Diagnosis not present
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
## 2018-05-30 ENCOUNTER — Encounter: Payer: Self-pay | Admitting: Obstetrics and Gynecology

## 2018-05-30 ENCOUNTER — Ambulatory Visit (INDEPENDENT_AMBULATORY_CARE_PROVIDER_SITE_OTHER): Payer: 59 | Admitting: Obstetrics and Gynecology

## 2018-05-30 VITALS — BP 136/90 | HR 113 | Ht 65.0 in | Wt 188.0 lb

## 2018-05-30 DIAGNOSIS — Z0000 Encounter for general adult medical examination without abnormal findings: Secondary | ICD-10-CM

## 2018-05-30 DIAGNOSIS — N946 Dysmenorrhea, unspecified: Secondary | ICD-10-CM | POA: Diagnosis not present

## 2018-05-30 DIAGNOSIS — Z30011 Encounter for initial prescription of contraceptive pills: Secondary | ICD-10-CM

## 2018-05-30 DIAGNOSIS — Z01419 Encounter for gynecological examination (general) (routine) without abnormal findings: Secondary | ICD-10-CM | POA: Diagnosis not present

## 2018-05-30 DIAGNOSIS — Z124 Encounter for screening for malignant neoplasm of cervix: Secondary | ICD-10-CM

## 2018-05-30 DIAGNOSIS — Z1151 Encounter for screening for human papillomavirus (HPV): Secondary | ICD-10-CM | POA: Diagnosis not present

## 2018-05-30 MED ORDER — LEVONORGEST-ETH ESTRAD 91-DAY 0.15-0.03 &0.01 MG PO TABS: 1.0000 | ORAL_TABLET | Freq: Every day | ORAL | 3 refills | Status: DC

## 2018-05-30 NOTE — Progress Notes (Signed)
Chief Complaint  Patient presents with  . Gynecologic Exam    Wants to go back on bc     HPI:      [name] is a 37 y.o. [id number] who LMP was Patient's last menstrual period was 05/17/2018 (approximate)., presents today for her annual examination. Her menses are regular every 28-30 days, lasting 4-5 days. Has 1 heavy day, changing product Q1-2 hrs. Dysmenorrhea moderate, occurring first 1-2 days of flow. Takes motrin with mild improvement. She does not have intermenstrual bleeding. She is s/p endometrial ablation due to menorrhagia. Bleeding is improved but cramping is still bad. She took 3 mo OCP 2016 with sx relief and would like to restart. Had HTN last yr but found it was side effect of phentermine use. Pt stopped phentermine and BP returned to normal. Pt no longer taking BP meds. BP slightly elevated today because she didn't know she was getting annual/pap. Checks BP on apple watch and it's normal.   Sex activity: single partner, contraception - tubal ligation.  Last Pap: February 06, 2015  Results were: no abnormalities /neg HPV DNA  Hx of STDs: none  There is no FH of breast cancer. There is no FH of ovarian cancer. The patient does do self-breast exams.  Tobacco use: The patient denies current or previous tobacco use. Alcohol use: none Exercise: not active  She does get adequate calcium but not Vitamin D in her diet.  Labs with PCP.    Past Medical History:  Diagnosis Date  . Dysmenorrhea   . History of hypertension    due to phentermine  . Seasonal allergies     Past Surgical History:  Procedure Laterality Date  . CESAREAN SECTION  02/05/2005  . CESAREAN SECTION  01/04/2008  . CHOLECYSTECTOMY    . DENTAL SURGERY     Dental implant   . LAPAROSCOPY    . TONSILLECTOMY    . TUBAL LIGATION  2009    Family History  Problem Relation Age of Onset  . Cancer Neg Hx   . Diabetes Neg Hx   . Hyperlipidemia Neg Hx   . Hypertension Neg Hx   . Stroke Neg  Hx   . Breast cancer Neg Hx     Social History   Socioeconomic History  . Marital status: Married    Spouse name: Not on file  . Number of children: 3  . Years of education: Not on file  . Highest education level: Not on file  Occupational History  . Not on file  Social Needs  . Financial resource strain: Not on file  . Food insecurity:    Worry: Not on file    Inability: Not on file  . Transportation needs:    Medical: Not on file    Non-medical: Not on file  Tobacco Use  . Smoking status: Former Games developermoker  . Smokeless tobacco: Never Used  Substance and Sexual Activity  . Alcohol use: Yes    Comment: light  . Drug use: No  . Sexual activity: Yes    Birth control/protection: Surgical    Comment: Tubal ligation   Lifestyle  . Physical activity:    Days per week: Not on file    Minutes per session: Not on file  . Stress: Not on file  Relationships  . Social connections:    Talks on phone: Not on file    Gets together: Not on file    Attends religious service: Not on file  Active member of club or organization: Not on file    Attends meetings of clubs or organizations: Not on file    Relationship status: Not on file  . Intimate partner violence:    Fear of current or ex partner: Not on file    Emotionally abused: Not on file    Physically abused: Not on file    Forced sexual activity: Not on file  Other Topics Concern  . Not on file  Social History Narrative  . Not on file    Current Outpatient Medications on File Prior to Visit  Medication Sig Dispense Refill  . zolpidem (AMBIEN) 10 MG tablet Take 10 mg by mouth daily.    Marland Kitchen EPINEPHrine 0.3 mg/0.3 mL IJ SOAJ injection Inject 0.3 mg into the muscle as needed.    Marland Kitchen levocetirizine (XYZAL) 5 MG tablet Take 5 mg by mouth daily.     No current facility-administered medications on file prior to visit.       ROS:  Review of Systems  Constitutional: Negative for fatigue, fever and unexpected weight change.    Respiratory: Negative for cough, shortness of breath and wheezing.   Cardiovascular: Negative for chest pain, palpitations and leg swelling.  Gastrointestinal: Negative for blood in stool, constipation, diarrhea, nausea and vomiting.  Endocrine: Negative for cold intolerance, heat intolerance and polyuria.  Genitourinary: Negative for dyspareunia, dysuria, flank pain, frequency, genital sores, hematuria, menstrual problem, pelvic pain, urgency, vaginal bleeding, vaginal discharge and vaginal pain.  Musculoskeletal: Negative for back pain, joint swelling and myalgias.  Skin: Negative for rash.  Neurological: Negative for dizziness, syncope, light-headedness, numbness and headaches.  Hematological: Negative for adenopathy.  Psychiatric/Behavioral: Negative for agitation, confusion, sleep disturbance and suicidal ideas. The patient is not nervous/anxious.      Objective: BP 136/90   Pulse (!) 113   Ht 5\' 5"  (1.651 m)   Wt 188 lb (85.3 kg)   LMP 05/17/2018 (Approximate)   BMI 31.28 kg/m    Physical Exam  Constitutional: She is oriented to person, place, and time. She appears well-developed and well-nourished.  Genitourinary: Vagina normal and uterus normal. There is no rash or tenderness on the right labia. There is no rash or tenderness on the left labia. No erythema or tenderness in the vagina. No vaginal discharge found. Right adnexum does not display mass and does not display tenderness. Left adnexum does not display mass and does not display tenderness. Cervix does not exhibit motion tenderness or polyp. Uterus is not enlarged or tender.  Neck: Normal range of motion. No thyromegaly present.  Cardiovascular: Normal rate, regular rhythm and normal heart sounds.  No murmur heard. Pulmonary/Chest: Effort normal and breath sounds normal. Right breast exhibits no mass, no nipple discharge, no skin change and no tenderness. Left breast exhibits no mass, no nipple discharge, no skin change  and no tenderness.  Abdominal: Soft. There is no tenderness. There is no guarding.  Musculoskeletal: Normal range of motion.  Neurological: She is alert and oriented to person, place, and time. No cranial nerve deficit.  Psychiatric: She has a normal mood and affect. Her behavior is normal.  Vitals reviewed.   Assessment/Plan: Encounter for annual routine gynecological examination  Cervical cancer screening - Plan: IGP, Aptima HPV  Screening for HPV (human papillomavirus) - Plan: IGP, Aptima HPV  Encounter for initial prescription of contraceptive pills - OCP start. Rx seasonique. F/u prn. - Plan: Levonorgestrel-Ethinyl Estradiol (AMETHIA,CAMRESE) 0.15-0.03 &0.01 MG tablet  Dysmenorrhea - Restart OCPs. F/u prn.  -  Plan: Levonorgestrel-Ethinyl Estradiol (AMETHIA,CAMRESE) 0.15-0.03 &0.01 MG tablet  Meds ordered this encounter  Medications  . Levonorgestrel-Ethinyl Estradiol (AMETHIA,CAMRESE) 0.15-0.03 &0.01 MG tablet    Sig: Take 1 tablet by mouth daily.    Dispense:  1 Package    Refill:  3    Order Specific Question:   Supervising Provider    Answer:   Nadara Mustard [[id number]]             GYN counsel breast self exam, diet/exercise, calcium and Vit D supp     F/U  Return in about 1 year (around 05/31/2019).  Ramanda Paules B. Kateryn Marasigan, PA-C 05/30/2018 3:40 PM

## 2018-05-30 NOTE — Patient Instructions (Signed)
I value your feedback and entrusting us with your care. If you get a [facility] [facility] patient survey, I would appreciate you taking the time to let us know about your experience today. Thank you! 

## 2018-06-02 LAB — IGP, APTIMA HPV
HPV APTIMA: NEGATIVE
PAP SMEAR COMMENT: 0

## 2018-08-07 DIAGNOSIS — Z888 Allergy status to other drugs, medicaments and biological substances status: Secondary | ICD-10-CM | POA: Diagnosis not present

## 2018-08-07 DIAGNOSIS — R21 Rash and other nonspecific skin eruption: Secondary | ICD-10-CM | POA: Diagnosis not present

## 2018-09-27 DIAGNOSIS — Z888 Allergy status to other drugs, medicaments and biological substances status: Secondary | ICD-10-CM | POA: Diagnosis not present

## 2018-09-27 DIAGNOSIS — S336XXA Sprain of sacroiliac joint, initial encounter: Secondary | ICD-10-CM | POA: Diagnosis not present

## 2018-10-08 DIAGNOSIS — Z23 Encounter for immunization: Secondary | ICD-10-CM | POA: Diagnosis not present

## 2018-12-27 ENCOUNTER — Other Ambulatory Visit: Payer: Self-pay

## 2018-12-27 ENCOUNTER — Encounter: Payer: Self-pay | Admitting: Emergency Medicine

## 2018-12-27 ENCOUNTER — Emergency Department
Admission: EM | Admit: 2018-12-27 | Discharge: 2018-12-27 | Disposition: A | Discharge status: Home / Self Care | Payer: Commercial Managed Care - HMO | Attending: Emergency Medicine | Admitting: Emergency Medicine

## 2018-12-27 DIAGNOSIS — M62838 Other muscle spasm: Secondary | ICD-10-CM | POA: Diagnosis not present

## 2018-12-27 DIAGNOSIS — Z87891 Personal history of nicotine dependence: Secondary | ICD-10-CM | POA: Insufficient documentation

## 2018-12-27 DIAGNOSIS — S39012A Strain of muscle, fascia and tendon of lower back, initial encounter: Secondary | ICD-10-CM | POA: Insufficient documentation

## 2018-12-27 DIAGNOSIS — Y9389 Activity, other specified: Secondary | ICD-10-CM | POA: Insufficient documentation

## 2018-12-27 DIAGNOSIS — M542 Cervicalgia: Secondary | ICD-10-CM | POA: Diagnosis not present

## 2018-12-27 DIAGNOSIS — Z79899 Other long term (current) drug therapy: Secondary | ICD-10-CM | POA: Diagnosis not present

## 2018-12-27 DIAGNOSIS — S3992XA Unspecified injury of lower back, initial encounter: Secondary | ICD-10-CM | POA: Diagnosis present

## 2018-12-27 DIAGNOSIS — Y998 Other external cause status: Secondary | ICD-10-CM | POA: Insufficient documentation

## 2018-12-27 DIAGNOSIS — I10 Essential (primary) hypertension: Secondary | ICD-10-CM | POA: Insufficient documentation

## 2018-12-27 DIAGNOSIS — Y92410 Unspecified street and highway as the place of occurrence of the external cause: Secondary | ICD-10-CM | POA: Insufficient documentation

## 2018-12-27 MED ORDER — MELOXICAM 15 MG PO TABS: 15.0000 mg | ORAL_TABLET | Freq: Every day | ORAL | 0 refills | Status: DC

## 2018-12-27 MED ORDER — METHOCARBAMOL 500 MG PO TABS: 500.0000 mg | ORAL_TABLET | Freq: Four times a day (QID) | ORAL | 0 refills | Status: DC
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
## 2018-12-31 ENCOUNTER — Other Ambulatory Visit: Payer: Self-pay | Admitting: Chiropractor

## 2018-12-31 ENCOUNTER — Ambulatory Visit
Admission: RE | Admit: 2018-12-31 | Discharge: 2018-12-31 | Disposition: A | Discharge status: Home / Self Care | Payer: Commercial Managed Care - HMO | Source: Ambulatory Visit | Attending: Chiropractor | Admitting: Chiropractor

## 2018-12-31 DIAGNOSIS — M50322 Other cervical disc degeneration at C5-C6 level: Secondary | ICD-10-CM | POA: Diagnosis present

## 2019-03-25 ENCOUNTER — Other Ambulatory Visit: Payer: Self-pay | Admitting: Internal Medicine

## 2019-03-25 DIAGNOSIS — E039 Hypothyroidism, unspecified: Secondary | ICD-10-CM

## 2019-04-16 ENCOUNTER — Other Ambulatory Visit: Payer: Self-pay

## 2019-04-16 ENCOUNTER — Ambulatory Visit
Admission: RE | Admit: 2019-04-16 | Discharge: 2019-04-16 | Disposition: A | Discharge status: Home / Self Care | Payer: Federal, State, Local not specified - PPO | Source: Ambulatory Visit | Attending: Internal Medicine | Admitting: Internal Medicine

## 2019-04-16 DIAGNOSIS — E039 Hypothyroidism, unspecified: Secondary | ICD-10-CM | POA: Insufficient documentation

## 2019-04-22 DIAGNOSIS — E049 Nontoxic goiter, unspecified: Secondary | ICD-10-CM | POA: Diagnosis not present

## 2019-06-03 DIAGNOSIS — E049 Nontoxic goiter, unspecified: Secondary | ICD-10-CM | POA: Diagnosis not present

## 2019-09-10 DIAGNOSIS — E039 Hypothyroidism, unspecified: Secondary | ICD-10-CM | POA: Diagnosis not present

## 2019-09-10 DIAGNOSIS — R21 Rash and other nonspecific skin eruption: Secondary | ICD-10-CM | POA: Diagnosis not present

## 2019-09-10 DIAGNOSIS — E049 Nontoxic goiter, unspecified: Secondary | ICD-10-CM | POA: Diagnosis not present

## 2019-10-14 DIAGNOSIS — H01134 Eczematous dermatitis of left upper eyelid: Secondary | ICD-10-CM | POA: Diagnosis not present

## 2019-10-14 DIAGNOSIS — H01131 Eczematous dermatitis of right upper eyelid: Secondary | ICD-10-CM | POA: Diagnosis not present

## 2019-11-01 DIAGNOSIS — R05 Cough: Secondary | ICD-10-CM | POA: Diagnosis not present

## 2019-11-01 DIAGNOSIS — Z20828 Contact with and (suspected) exposure to other viral communicable diseases: Secondary | ICD-10-CM | POA: Diagnosis not present

## 2019-12-06 DIAGNOSIS — E040 Nontoxic diffuse goiter: Secondary | ICD-10-CM | POA: Diagnosis not present

## 2019-12-06 DIAGNOSIS — I10 Essential (primary) hypertension: Secondary | ICD-10-CM | POA: Diagnosis not present

## 2019-12-06 DIAGNOSIS — R002 Palpitations: Secondary | ICD-10-CM | POA: Diagnosis not present

## 2019-12-16 ENCOUNTER — Ambulatory Visit: Payer: Federal, State, Local not specified - PPO | Attending: Internal Medicine

## 2019-12-16 DIAGNOSIS — Z20828 Contact with and (suspected) exposure to other viral communicable diseases: Secondary | ICD-10-CM | POA: Diagnosis not present

## 2019-12-16 DIAGNOSIS — Z20822 Contact with and (suspected) exposure to covid-19: Secondary | ICD-10-CM

## 2019-12-18 LAB — NOVEL CORONAVIRUS, NAA: SARS-CoV-2, NAA: NOT DETECTED

## 2020-01-31 DIAGNOSIS — I10 Essential (primary) hypertension: Secondary | ICD-10-CM | POA: Diagnosis not present

## 2020-01-31 DIAGNOSIS — F5104 Psychophysiologic insomnia: Secondary | ICD-10-CM | POA: Diagnosis not present

## 2020-01-31 DIAGNOSIS — E049 Nontoxic goiter, unspecified: Secondary | ICD-10-CM | POA: Diagnosis not present

## 2020-05-01 ENCOUNTER — Ambulatory Visit: Payer: Federal, State, Local not specified - PPO | Admitting: Internal Medicine
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
## 2020-05-21 ENCOUNTER — Encounter: Payer: Self-pay | Admitting: Internal Medicine

## 2020-05-21 ENCOUNTER — Ambulatory Visit: Payer: Federal, State, Local not specified - PPO | Admitting: Internal Medicine

## 2020-05-21 ENCOUNTER — Other Ambulatory Visit: Payer: Self-pay

## 2020-05-21 VITALS — BP 143/95 | HR 119 | Wt 190.7 lb

## 2020-05-21 DIAGNOSIS — I10 Essential (primary) hypertension: Secondary | ICD-10-CM | POA: Diagnosis not present

## 2020-05-21 DIAGNOSIS — M47812 Spondylosis without myelopathy or radiculopathy, cervical region: Secondary | ICD-10-CM | POA: Diagnosis not present

## 2020-05-21 DIAGNOSIS — E669 Obesity, unspecified: Secondary | ICD-10-CM | POA: Diagnosis not present

## 2020-05-21 DIAGNOSIS — M4692 Unspecified inflammatory spondylopathy, cervical region: Secondary | ICD-10-CM

## 2020-05-21 DIAGNOSIS — Z68.41 Body mass index (BMI) pediatric, greater than or equal to 95th percentile for age: Secondary | ICD-10-CM | POA: Diagnosis not present

## 2020-05-21 MED ORDER — PHENTERMINE HCL 15 MG PO CAPS: 15.0000 mg | ORAL_CAPSULE | ORAL | 1 refills | Status: DC

## 2020-05-21 MED ORDER — MELOXICAM 7.5 MG PO TABS
7.5000 mg | ORAL_TABLET | Freq: Every day | ORAL | 0 refills | Status: DC
Start: 2020-05-21 — End: 2020-06-15

## 2020-05-21 NOTE — Progress Notes (Signed)
Established Patient Office Visit  Subjective:  Patient ID: Victoria Mason, female    DOB: 06/28/1981  Age: 39 y.o. MRN: [id number]  CC:  Chief Complaint  Patient presents with  . Neck Pain    pt complains of neck pain for the past 4 days, pt states the pain radiates up to the back of her head.    HPI  [name] presents for neck pain.  The pain does not radiate to the right shoulder or left shoulder but movement of the neck is restricted.  She denies any chest pain.  No shortness of breath.  Past Medical History:  Diagnosis Date  . Dysmenorrhea   . History of hypertension    due to phentermine  . Seasonal allergies     Past Surgical History:  Procedure Laterality Date  . CESAREAN SECTION  02/05/2005  . CESAREAN SECTION  01/04/2008  . CHOLECYSTECTOMY    . DENTAL SURGERY     Dental implant   . LAPAROSCOPY    . TONSILLECTOMY    . TUBAL LIGATION  2009    Family History  Problem Relation Age of Onset  . Cancer Neg Hx   . Diabetes Neg Hx   . Hyperlipidemia Neg Hx   . Hypertension Neg Hx   . Stroke Neg Hx   . Breast cancer Neg Hx     Social History   Socioeconomic History  . Marital status: Married    Spouse name: Not on file  . Number of children: 3  . Years of education: Not on file  . Highest education level: Not on file  Occupational History  . Not on file  Tobacco Use  . Smoking status: Former Research scientist (life sciences)  . Smokeless tobacco: Never Used  Substance and Sexual Activity  . Alcohol use: Yes    Comment: light  . Drug use: No  . Sexual activity: Yes    Birth control/protection: Surgical    Comment: Tubal ligation   Other Topics Concern  . Not on file  Social History Narrative  . Not on file   Social Determinants of Health   Financial Resource Strain:   . Difficulty of Paying Living Expenses:   Food Insecurity:   . Worried About Charity fundraiser in the Last Year:   . Arboriculturist in the Last Year:   Transportation Needs:   . Lexicographer (Medical):   Marland Kitchen Lack of Transportation (Non-Medical):   Physical Activity:   . Days of Exercise per Week:   . Minutes of Exercise per Session:   Stress:   . Feeling of Stress :   Social Connections:   . Frequency of Communication with Friends and Family:   . Frequency of Social Gatherings with Friends and Family:   . Attends Religious Services:   . Active Member of Clubs or Organizations:   . Attends Archivist Meetings:   Marland Kitchen Marital Status:   Intimate Partner Violence:   . Fear of Current or Ex-Partner:   . Emotionally Abused:   Marland Kitchen Physically Abused:   . Sexually Abused:      Current Outpatient Medications:  .  amLODipine (NORVASC) 5 MG tablet, Take 5 mg by mouth daily. Specific dosage is unknown, Disp: , Rfl:  .  EPINEPHrine 0.3 mg/0.3 mL IJ SOAJ injection, Inject 0.3 mg into the muscle as needed., Disp: , Rfl:  .  zolpidem (AMBIEN) 10 MG tablet, Take 10 mg by mouth daily., Disp: , Rfl:  Allergies  Allergen Reactions  . Morphine And Related   . Pollen Extract Other (See Comments)    Nasal congestion    ROS Review of Systems  Constitutional: Negative.   HENT: Negative.   Eyes: Negative.   Respiratory: Negative.   Cardiovascular: Negative.   Gastrointestinal: Negative.   Endocrine: Negative.   Genitourinary: Negative.   Musculoskeletal: Positive for arthralgias (pain in neck ).      Objective:    Physical Exam  Constitutional: She appears well-developed and well-nourished.  HENT:  Head: Normocephalic and atraumatic.  Eyes: Pupils are equal, round, and reactive to light.  Neck: No JVD present. No tracheal deviation present. No thyromegaly present.  Movements and neck are painful.  There is no swelling no tenderness in the cervical spine area.  There is no deviation of pain to the neck shoulder and arm.  Lymphadenopathy:    She has no cervical adenopathy.    BP (!) 143/95   Pulse (!) 119   Wt 190 lb 11.2 oz (86.5 kg)   BMI 32.73  kg/m  Wt Readings from Last 3 Encounters:  05/21/20 190 lb 11.2 oz (86.5 kg)  12/27/18 175 lb (79.4 kg)  05/30/18 188 lb (85.3 kg)     Health Maintenance Due  Topic Date Due  . COVID-19 Vaccine (1) Never done  . HIV Screening  Never done  . TETANUS/TDAP  Never done    There are no preventive care reminders to display for this patient.  No results found for: TSH No results found for: WBC, HGB, HCT, MCV, PLT No results found for: NA, K, CHLORIDE, CO2, GLUCOSE, BUN, CREATININE, BILITOT, ALKPHOS, AST, ALT, PROT, ALBUMIN, CALCIUM, ANIONGAP, EGFR, GFR No results found for: CHOL No results found for: HDL No results found for: LDLCALC No results found for: TRIG No results found for: CHOLHDL No results found for: HGBA1C    Assessment & Plan:   Problem List Items Addressed This Visit    None     1. Neck arthropathy Patient is tender on the left side of the neck.  She is not tender in the cervical spine area neck movement to the left side are painful.  There is no radiation of the pain to the shoulder and arm. - meloxicam (MOBIC) 7.5 MG tablet; Take 1 tablet (7.5 mg total) by mouth daily.  Dispense: 30 tablet;   2. Essential hypertension Blood pressure is under control.  3. Obesity with body mass index (BMI)  unspecified obesity type Put on a diet and phentermine. - phentermine 15 MG capsule; Take 1 capsule (15 mg total) by mouth every morning.  Dispense: 30 capsule; Refill: 1  4. Cervical arthritis Use neck brace. No orders of the defined types were placed in this encounter.   Follow-up: No follow-ups on file.    Cletis Athens, MD

## 2020-06-13 ENCOUNTER — Other Ambulatory Visit: Payer: Self-pay | Admitting: Internal Medicine

## 2020-06-13 DIAGNOSIS — M47812 Spondylosis without myelopathy or radiculopathy, cervical region: Secondary | ICD-10-CM

## 2020-07-25 ENCOUNTER — Other Ambulatory Visit: Payer: Self-pay | Admitting: Internal Medicine

## 2020-07-25 DIAGNOSIS — Z68.41 Body mass index (BMI) pediatric, greater than or equal to 95th percentile for age: Secondary | ICD-10-CM

## 2020-07-25 DIAGNOSIS — E669 Obesity, unspecified: Secondary | ICD-10-CM

## 2020-08-03 ENCOUNTER — Other Ambulatory Visit: Payer: Self-pay | Admitting: Internal Medicine

## 2020-08-03 DIAGNOSIS — E669 Obesity, unspecified: Secondary | ICD-10-CM

## 2020-08-03 DIAGNOSIS — Z68.41 Body mass index (BMI) pediatric, greater than or equal to 95th percentile for age: Secondary | ICD-10-CM

## 2020-08-07 ENCOUNTER — Other Ambulatory Visit: Payer: Self-pay

## 2020-08-07 ENCOUNTER — Ambulatory Visit (INDEPENDENT_AMBULATORY_CARE_PROVIDER_SITE_OTHER): Payer: Federal, State, Local not specified - PPO | Admitting: Internal Medicine

## 2020-08-07 ENCOUNTER — Encounter: Payer: Self-pay | Admitting: Internal Medicine

## 2020-08-07 VITALS — BP 135/87 | HR 106 | Ht 64.0 in | Wt 187.6 lb

## 2020-08-07 DIAGNOSIS — L409 Psoriasis, unspecified: Secondary | ICD-10-CM | POA: Insufficient documentation

## 2020-08-07 DIAGNOSIS — E669 Obesity, unspecified: Secondary | ICD-10-CM

## 2020-08-07 DIAGNOSIS — M47812 Spondylosis without myelopathy or radiculopathy, cervical region: Secondary | ICD-10-CM | POA: Diagnosis not present

## 2020-08-07 DIAGNOSIS — F5101 Primary insomnia: Secondary | ICD-10-CM | POA: Insufficient documentation

## 2020-08-07 DIAGNOSIS — I10 Essential (primary) hypertension: Secondary | ICD-10-CM | POA: Diagnosis not present

## 2020-08-07 DIAGNOSIS — Z68.41 Body mass index (BMI) pediatric, greater than or equal to 95th percentile for age: Secondary | ICD-10-CM

## 2020-08-07 MED ORDER — PHENTERMINE HCL 15 MG PO CAPS: 15.0000 mg | ORAL_CAPSULE | ORAL | 0 refills | Status: DC

## 2020-08-07 MED ORDER — ZOLPIDEM TARTRATE 10 MG PO TABS: 10.0000 mg | ORAL_TABLET | Freq: Every day | ORAL | 0 refills | Status: DC

## 2020-08-07 NOTE — Assessment & Plan Note (Signed)
Ref to dermatology

## 2020-08-07 NOTE — Assessment & Plan Note (Signed)
Lose wt  And avoid  coffe

## 2020-08-07 NOTE — Assessment & Plan Note (Signed)
Stable

## 2020-08-07 NOTE — Assessment & Plan Note (Signed)
-   Today, the patient's blood pressure is well managed on amlodapine. - The patient will continue the current treatment regimen.  - I encouraged the patient to eat a low-sodium diet to help control blood pressure. - I encouraged the patient to live an active lifestyle and complete activities that increases heart rate to 85% target heart rate at least 5 times per week for one hour.     

## 2020-08-07 NOTE — Progress Notes (Signed)
Established Patient Office Visit  Subjective:  Patient ID: Victoria Mason, female    DOB: Dec 16, 1981  Age: 39 y.o. MRN: [id number]  CC:  Chief Complaint  Patient presents with  . Weight Check    Patient needs a refill on her phenterimine  . Insomnia    Patient needs a refill on her Ambein    Insomnia Primary symptoms: sleep disturbance.  The onset quality is undetermined. The problem occurs nightly. The problem is unchanged. The symptoms are aggravated by work stress. The symptoms are relieved by change in diet. Past treatments include medication. The treatment provided moderate relief. PMH includes: no family stress or anxiety, no restless leg syndrome, no chronic pain.    Victoria Mason presents fo rinsomnia  Past Medical History:  Diagnosis Date  . Dysmenorrhea   . History of hypertension    due to phentermine  . Seasonal allergies     Past Surgical History:  Procedure Laterality Date  . CESAREAN SECTION  02/05/2005  . CESAREAN SECTION  01/04/2008  . CHOLECYSTECTOMY    . DENTAL SURGERY     Dental implant   . LAPAROSCOPY    . TONSILLECTOMY    . TUBAL LIGATION  2009    Family History  Problem Relation Age of Onset  . Cancer Neg Hx   . Diabetes Neg Hx   . Hyperlipidemia Neg Hx   . Hypertension Neg Hx   . Stroke Neg Hx   . Breast cancer Neg Hx     Social History   Socioeconomic History  . Marital status: Married    Spouse name: Not on file  . Number of children: 3  . Years of education: Not on file  . Highest education level: Not on file  Occupational History  . Not on file  Tobacco Use  . Smoking status: Former Research scientist (life sciences)  . Smokeless tobacco: Never Used  Vaping Use  . Vaping Use: Never used  Substance and Sexual Activity  . Alcohol use: Yes    Comment: light  . Drug use: No  . Sexual activity: Yes    Birth control/protection: Surgical    Comment: Tubal ligation   Other Topics Concern  . Not on file  Social History Narrative  . Not on file    Social Determinants of Health   Financial Resource Strain:   . Difficulty of Paying Living Expenses:   Food Insecurity:   . Worried About Charity fundraiser in the Last Year:   . Arboriculturist in the Last Year:   Transportation Needs:   . Film/video editor (Medical):   Marland Kitchen Lack of Transportation (Non-Medical):   Physical Activity:   . Days of Exercise per Week:   . Minutes of Exercise per Session:   Stress:   . Feeling of Stress :   Social Connections:   . Frequency of Communication with Friends and Family:   . Frequency of Social Gatherings with Friends and Family:   . Attends Religious Services:   . Active Member of Clubs or Organizations:   . Attends Archivist Meetings:   Marland Kitchen Marital Status:   Intimate Partner Violence:   . Fear of Current or Ex-Partner:   . Emotionally Abused:   Marland Kitchen Physically Abused:   . Sexually Abused:      Current Outpatient Medications:  .  amLODipine (NORVASC) 5 MG tablet, Take 5 mg by mouth daily. Specific dosage is unknown, Disp: , Rfl:  .  EPINEPHrine 0.3  mg/0.3 mL IJ SOAJ injection, Inject 0.3 mg into the muscle as needed., Disp: , Rfl:  .  meloxicam (MOBIC) 7.5 MG tablet, TAKE 1 TABLET BY MOUTH EVERY DAY, Disp: 30 tablet, Rfl: 0 .  phentermine 15 MG capsule, Take 1 capsule (15 mg total) by mouth every morning., Disp: 30 capsule, Rfl: 0 .  zolpidem (AMBIEN) 10 MG tablet, Take 1 tablet (10 mg total) by mouth daily., Disp: 30 tablet, Rfl: 0   Allergies  Allergen Reactions  . Morphine And Related   . Pollen Extract Other (See Comments)    Nasal congestion    ROS Review of Systems  Psychiatric/Behavioral: Positive for sleep disturbance. The patient has insomnia.       Objective:    Physical Exam  BP 135/87   Pulse (!) 106   Ht 5\' 4"  (1.626 m)   Wt 187 lb 9.6 oz (85.1 kg)   BMI 32.20 kg/m  Wt Readings from Last 3 Encounters:  08/07/20 187 lb 9.6 oz (85.1 kg)  05/21/20 190 lb 11.2 oz (86.5 kg)  12/27/18 175 lb  (79.4 kg)     Health Maintenance Due  Topic Date Due  . Hepatitis C Screening  Never done  . COVID-19 Vaccine (1) Never done  . HIV Screening  Never done  . TETANUS/TDAP  Never done  . INFLUENZA VACCINE  07/26/2020    There are no preventive care reminders to display for this patient.  No results found for: TSH No results found for: WBC, HGB, HCT, MCV, PLT No results found for: NA, K, CHLORIDE, CO2, GLUCOSE, BUN, CREATININE, BILITOT, ALKPHOS, AST, ALT, PROT, ALBUMIN, CALCIUM, ANIONGAP, EGFR, GFR No results found for: CHOL No results found for: HDL No results found for: LDLCALC No results found for: TRIG No results found for: CHOLHDL No results found for: 09/25/2020    Assessment & Plan:   Problem List Items Addressed This Visit      Cardiovascular and Mediastinum   Essential hypertension - Primary    - Today, the patient's blood pressure is well managed on amlodapine. - The patient will continue the current treatment regimen.  - I encouraged the patient to eat a low-sodium diet to help control blood pressure. - I encouraged the patient to live an active lifestyle and complete activities that increases heart rate to 85% target heart rate at least 5 times per week for one hour.            Musculoskeletal and Integument   Cervical arthritis    Stable        Psoriasis and similar disorder    Ref to dermatology        Other   Obesity with body mass index (BMI) in 95th to 98th percentile for age in pediatric patient    - I encouraged the patient to lose weight.  - I educated them on making healthy dietary choices including eating more fruits and vegetables and less fried foods. - I encouraged the patient to exercise more, and educated on the benefits of exercise including weight loss, diabetes prevention, and hypertension prevention.        Relevant Medications   phentermine 15 MG capsule   Primary insomnia    Lose wt  And avoid  coffe         Meds ordered  this encounter  Medications  . phentermine 15 MG capsule    Sig: Take 1 capsule (15 mg total) by mouth every morning.    Dispense:  30 capsule    Refill:  0  . zolpidem (AMBIEN) 10 MG tablet    Sig: Take 1 tablet (10 mg total) by mouth daily.    Dispense:  30 tablet    Refill:  0    Follow-up: No follow-ups on file.    Cletis Athens, MD

## 2020-08-07 NOTE — Assessment & Plan Note (Signed)
-   I encouraged the patient to lose weight.  - I educated them on making healthy dietary choices including eating more fruits and vegetables and less fried foods. - I encouraged the patient to exercise more, and educated on the benefits of exercise including weight loss, diabetes prevention, and hypertension prevention.   

## 2020-09-03 ENCOUNTER — Other Ambulatory Visit: Payer: Self-pay

## 2020-09-03 DIAGNOSIS — E669 Obesity, unspecified: Secondary | ICD-10-CM

## 2020-09-03 MED ORDER — PHENTERMINE HCL 15 MG PO CAPS: 15.0000 mg | ORAL_CAPSULE | ORAL | 1 refills | Status: DC

## 2020-09-03 MED ORDER — ZOLPIDEM TARTRATE 10 MG PO TABS: 10.0000 mg | ORAL_TABLET | Freq: Every day | ORAL | 1 refills | Status: DC

## 2020-09-15 ENCOUNTER — Other Ambulatory Visit: Payer: Self-pay

## 2020-09-15 ENCOUNTER — Ambulatory Visit (INDEPENDENT_AMBULATORY_CARE_PROVIDER_SITE_OTHER): Payer: Federal, State, Local not specified - PPO | Admitting: Internal Medicine

## 2020-09-15 ENCOUNTER — Encounter: Payer: Self-pay | Admitting: Internal Medicine

## 2020-09-15 VITALS — BP 138/86 | HR 82 | Ht 64.0 in | Wt 187.1 lb

## 2020-09-15 DIAGNOSIS — E669 Obesity, unspecified: Secondary | ICD-10-CM | POA: Diagnosis not present

## 2020-09-15 DIAGNOSIS — F5101 Primary insomnia: Secondary | ICD-10-CM

## 2020-09-15 DIAGNOSIS — I10 Essential (primary) hypertension: Secondary | ICD-10-CM

## 2020-09-15 DIAGNOSIS — M9901 Segmental and somatic dysfunction of cervical region: Secondary | ICD-10-CM | POA: Diagnosis not present

## 2020-09-15 DIAGNOSIS — Z68.41 Body mass index (BMI) pediatric, greater than or equal to 95th percentile for age: Secondary | ICD-10-CM

## 2020-09-15 MED ORDER — PREDNISONE 20 MG PO TABS: 20.0000 mg | ORAL_TABLET | Freq: Every day | ORAL | 1 refills | Status: AC

## 2020-09-15 MED ORDER — CYCLOBENZAPRINE HCL 10 MG PO TABS: 10.0000 mg | ORAL_TABLET | Freq: Every day | ORAL | 0 refills | Status: DC

## 2020-09-15 NOTE — Assessment & Plan Note (Signed)
Complain of pain in the right side of the neck   pain on flexion extension external and internal rotation with pain also goes on to the right shoulder there is no numbness of the hand.  There is no history of any neck injury.  There is no swelling of the  inflamm of the neck no lymphadenopathy.  Patient was started on Flexeril 10 mg p.o. half a tablet at night.  Also given prednisone 20 mg 1 tablet stat and then 40 mg p.o. daily for 5 days.

## 2020-09-15 NOTE — Assessment & Plan Note (Signed)
-   I encouraged the patient to lose weight.  - I educated them on making healthy dietary choices including eating more fruits and vegetables and less fried foods. - I encouraged the patient to exercise more, and educated on the benefits of exercise including weight loss, diabete, and hypertension prevention.

## 2020-09-15 NOTE — Assessment & Plan Note (Signed)
-   Today, the patient's blood pressure is well managed on amlodapine. - The patient will continue the current treatment regimen.  - I encouraged the patient to eat a low-sodium diet to help control blood pressure. - I encouraged the patient to live an active lifestyle and complete activities that increases heart rate to 85% target heart rate at least 5 times per week for one hour.     

## 2020-09-15 NOTE — Progress Notes (Signed)
Established Patient Office Visit  Subjective:  Patient ID: Victoria Mason, female    DOB: [birth year]  Age: 39 y.o. MRN: [id number]  CC:  Chief Complaint  Patient presents with  . Neck Pain    pain that starts in neck and radiates into right shoulder, x 2 months on and off     HPI  Victoria Mason presents for necpain  Past Medical History:  Diagnosis Date  . Dysmenorrhea   . History of hypertension    due to phentermine  . Seasonal allergies     Past Surgical History:  Procedure Laterality Date  . CESAREAN SECTION  02/05/2005  . CESAREAN SECTION  01/04/2008  . CHOLECYSTECTOMY    . DENTAL SURGERY     Dental implant   . LAPAROSCOPY    . TONSILLECTOMY    . TUBAL LIGATION  2009    Family History  Problem Relation Age of Onset  . Cancer Neg Hx   . Diabetes Neg Hx   . Hyperlipidemia Neg Hx   . Hypertension Neg Hx   . Stroke Neg Hx   . Breast cancer Neg Hx     Social History   Socioeconomic History  . Marital status: Married    Spouse name: Not on file  . Number of children: 3  . Years of education: Not on file  . Highest education level: Not on file  Occupational History  . Not on file  Tobacco Use  . Smoking status: Former Research scientist (life sciences)  . Smokeless tobacco: Never Used  Vaping Use  . Vaping Use: Never used  Substance and Sexual Activity  . Alcohol use: Yes    Comment: light  . Drug use: No  . Sexual activity: Yes    Birth control/protection: Surgical    Comment: Tubal ligation   Other Topics Concern  . Not on file  Social History Narrative  . Not on file   Social Determinants of Health   Financial Resource Strain:   . Difficulty of Paying Living Expenses: Not on file  Food Insecurity:   . Worried About Charity fundraiser in the Last Year: Not on file  . Ran Out of Food in the Last Year: Not on file  Transportation Needs:   . Lack of Transportation (Medical): Not on file  . Lack of Transportation (Non-Medical): Not on file  Physical Activity:   .  Days of Exercise per Week: Not on file  . Minutes of Exercise per Session: Not on file  Stress:   . Feeling of Stress : Not on file  Social Connections:   . Frequency of Communication with Friends and Family: Not on file  . Frequency of Social Gatherings with Friends and Family: Not on file  . Attends Religious Services: Not on file  . Active Member of Clubs or Organizations: Not on file  . Attends Archivist Meetings: Not on file  . Marital Status: Not on file  Intimate Partner Violence:   . Fear of Current or Ex-Partner: Not on file  . Emotionally Abused: Not on file  . Physically Abused: Not on file  . Sexually Abused: Not on file     Current Outpatient Medications:  .  amLODipine (NORVASC) 5 MG tablet, Take 5 mg by mouth daily. Specific dosage is unknown, Disp: , Rfl:  .  EPINEPHrine 0.3 mg/0.3 mL IJ SOAJ injection, Inject 0.3 mg into the muscle as needed., Disp: , Rfl:  .  meloxicam (MOBIC) 7.5 MG tablet, TAKE  1 TABLET BY MOUTH EVERY DAY, Disp: 30 tablet, Rfl: 0 .  phentermine 15 MG capsule, Take 1 capsule (15 mg total) by mouth every morning., Disp: 30 capsule, Rfl: 1 .  zolpidem (AMBIEN) 10 MG tablet, Take 1 tablet (10 mg total) by mouth daily., Disp: 30 tablet, Rfl: 1 .  cyclobenzaprine (FLEXERIL) 10 MG tablet, Take 1 tablet (10 mg total) by mouth at bedtime., Disp: 30 tablet, Rfl: 0 .  predniSONE (DELTASONE) 20 MG tablet, Take 1 tablet (20 mg total) by mouth daily with breakfast for 5 days., Disp: 6 tablet, Rfl: 1   Allergies  Allergen Reactions  . Morphine And Related   . Pollen Extract Other (See Comments)    Nasal congestion    ROS Review of Systems  Constitutional: Negative.   HENT: Negative.   Eyes: Negative.   Respiratory: Negative.   Cardiovascular: Negative.   Gastrointestinal: Negative.   Endocrine: Negative.   Genitourinary: Negative.   Musculoskeletal: Positive for neck pain. Negative for back pain.  Skin: Negative.   Allergic/Immunologic:  Negative.   Neurological: Negative.   Hematological: Negative.   Psychiatric/Behavioral: Negative.   All other systems reviewed and are negative.     Objective:    Physical Exam Vitals reviewed.  Constitutional:      Appearance: Normal appearance.       Comments: Pain on the back of the neck and right side of the shoulder difficulty in flexion extension and internal and external rotation  HENT:     Mouth/Throat:     Mouth: Mucous membranes are moist.  Eyes:     Pupils: Pupils are equal, round, and reactive to light.  Cardiovascular:     Rate and Rhythm: Normal rate and regular rhythm.     Pulses: Normal pulses.     Heart sounds: Normal heart sounds.  Pulmonary:     Effort: Pulmonary effort is normal.     Breath sounds: Normal breath sounds.  Abdominal:     General: Bowel sounds are normal.     Palpations: Abdomen is soft. There is no hepatomegaly, splenomegaly or mass.     Tenderness: There is no abdominal tenderness.     Hernia: No hernia is present.  Musculoskeletal:        General: No tenderness.     Cervical back: Neck supple. No rigidity.     Right lower leg: No edema.     Left lower leg: No edema.  Lymphadenopathy:     Cervical: No cervical adenopathy.  Skin:    Findings: No rash.  Neurological:     Mental Status: She is alert and oriented to person, place, and time.     Motor: No weakness.  Psychiatric:        Mood and Affect: Mood and affect normal.        Behavior: Behavior normal.     BP 138/86   Pulse 82   Ht $R'5\' 4"'se$  (1.626 m)   Wt 187 lb 1.6 oz (84.9 kg)   BMI 32.12 kg/m  Wt Readings from Last 3 Encounters:  09/15/20 187 lb 1.6 oz (84.9 kg)  08/07/20 187 lb 9.6 oz (85.1 kg)  05/21/20 190 lb 11.2 oz (86.5 kg)     Health Maintenance Due  Topic Date Due  . Hepatitis C Screening  Never done  . COVID-19 Vaccine (1) Never done  . HIV Screening  Never done  . TETANUS/TDAP  Never done  . INFLUENZA VACCINE  Never done  There are no  preventive care reminders to display for this patient.  No results found for: TSH No results found for: WBC, HGB, HCT, MCV, PLT No results found for: NA, K, CHLORIDE, CO2, GLUCOSE, BUN, CREATININE, BILITOT, ALKPHOS, AST, ALT, PROT, ALBUMIN, CALCIUM, ANIONGAP, EGFR, GFR No results found for: CHOL No results found for: HDL No results found for: LDLCALC No results found for: TRIG No results found for: CHOLHDL No results found for: HGBA1C    Assessment & Plan:   Problem List Items Addressed This Visit      Cardiovascular and Mediastinum   Essential hypertension    - Today, the patient's blood pressure is well managed on amlodapine. - The patient will continue the current treatment regimen.  - I encouraged the patient to eat a low-sodium diet to help control blood pressure. - I encouraged the patient to live an active lifestyle and complete activities that increases heart rate to 85% target heart rate at least 5 times per week for one hour.            Other   Obesity with body mass index (BMI) in 95th to 98th percentile for age in pediatric patient    - I encouraged the patient to lose weight.  - I educated them on making healthy dietary choices including eating more fruits and vegetables and less fried foods. - I encouraged the patient to exercise more, and educated on the benefits of exercise including weight loss, diabete, and hypertension prevention.        Primary insomnia   Cervical (neck) region somatic dysfunction - Primary    Complain of pain in the right side of the neck   pain on flexion extension external and internal rotation with pain also goes on to the right shoulder there is no numbness of the hand.  There is no history of any neck injury.  There is no swelling of the  inflamm of the neck no lymphadenopathy.  Patient was started on Flexeril 10 mg p.o. half a tablet at night.  Also given prednisone 20 mg 1 tablet stat and then 40 mg p.o. daily for 5 days.       Relevant Medications   predniSONE (DELTASONE) 20 MG tablet   cyclobenzaprine (FLEXERIL) 10 MG tablet      Meds ordered this encounter  Medications  . predniSONE (DELTASONE) 20 MG tablet    Sig: Take 1 tablet (20 mg total) by mouth daily with breakfast for 5 days.    Dispense:  6 tablet    Refill:  1  . cyclobenzaprine (FLEXERIL) 10 MG tablet    Sig: Take 1 tablet (10 mg total) by mouth at bedtime.    Dispense:  30 tablet    Refill:  0    Follow-up: No follow-ups on file.    Cletis Athens, MD

## 2020-09-21 ENCOUNTER — Ambulatory Visit: Payer: Federal, State, Local not specified - PPO | Admitting: Internal Medicine

## 2020-10-30 ENCOUNTER — Other Ambulatory Visit: Payer: Self-pay

## 2020-10-30 ENCOUNTER — Encounter: Payer: Self-pay | Admitting: Family Medicine

## 2020-10-30 ENCOUNTER — Ambulatory Visit (INDEPENDENT_AMBULATORY_CARE_PROVIDER_SITE_OTHER): Payer: Federal, State, Local not specified - PPO | Admitting: Family Medicine

## 2020-10-30 VITALS — BP 148/106 | HR 130 | Ht 64.0 in | Wt 190.3 lb

## 2020-10-30 DIAGNOSIS — I10 Essential (primary) hypertension: Secondary | ICD-10-CM

## 2020-10-30 DIAGNOSIS — F5101 Primary insomnia: Secondary | ICD-10-CM | POA: Diagnosis not present

## 2020-10-30 MED ORDER — EPINEPHRINE 0.3 MG/0.3ML IJ SOAJ: 0.3000 mg | INTRAMUSCULAR | 3 refills | Status: AC | PRN

## 2020-10-30 MED ORDER — ZOLPIDEM TARTRATE 10 MG PO TABS
10.0000 mg | ORAL_TABLET | Freq: Every day | ORAL | 2 refills | Status: DC
Start: 2020-10-30 — End: 2021-01-29

## 2020-10-30 NOTE — Assessment & Plan Note (Signed)
Pt here today for Ambien rx, has been on the med for years. She gets 6 hours of broken sleep but feels really well in the am.

## 2020-10-30 NOTE — Progress Notes (Signed)
Established Patient Office Visit  SUBJECTIVE:  Subjective  Patient ID: Victoria Mason, female    DOB: 07-13-1981  Age: 39 y.o. MRN: [id number]  CC:  Chief Complaint  Patient presents with  . Insomnia    Patient is here for a medication refill on her Ambein    HPI Victoria Mason is a 39 y.o. female presenting today for refill on meds and htn fu.   Past Medical History:  Diagnosis Date  . Dysmenorrhea   . History of hypertension    due to phentermine  . Seasonal allergies     Past Surgical History:  Procedure Laterality Date  . CESAREAN SECTION  02/05/2005  . CESAREAN SECTION  01/04/2008  . CHOLECYSTECTOMY    . DENTAL SURGERY     Dental implant   . LAPAROSCOPY    . TONSILLECTOMY    . TUBAL LIGATION  2009    Family History  Problem Relation Age of Onset  . Cancer Neg Hx   . Diabetes Neg Hx   . Hyperlipidemia Neg Hx   . Hypertension Neg Hx   . Stroke Neg Hx   . Breast cancer Neg Hx     Social History   Socioeconomic History  . Marital status: Married    Spouse name: Not on file  . Number of children: 3  . Years of education: Not on file  . Highest education level: Not on file  Occupational History  . Not on file  Tobacco Use  . Smoking status: Former Research scientist (life sciences)  . Smokeless tobacco: Never Used  Vaping Use  . Vaping Use: Never used  Substance and Sexual Activity  . Alcohol use: Yes    Comment: light  . Drug use: No  . Sexual activity: Yes    Birth control/protection: Surgical    Comment: Tubal ligation   Other Topics Concern  . Not on file  Social History Narrative  . Not on file   Social Determinants of Health   Financial Resource Strain:   . Difficulty of Paying Living Expenses: Not on file  Food Insecurity:   . Worried About Charity fundraiser in the Last Year: Not on file  . Ran Out of Food in the Last Year: Not on file  Transportation Needs:   . Lack of Transportation (Medical): Not on file  . Lack of Transportation (Non-Medical): Not on  file  Physical Activity:   . Days of Exercise per Week: Not on file  . Minutes of Exercise per Session: Not on file  Stress:   . Feeling of Stress : Not on file  Social Connections:   . Frequency of Communication with Friends and Family: Not on file  . Frequency of Social Gatherings with Friends and Family: Not on file  . Attends Religious Services: Not on file  . Active Member of Clubs or Organizations: Not on file  . Attends Archivist Meetings: Not on file  . Marital Status: Not on file  Intimate Partner Violence:   . Fear of Current or Ex-Partner: Not on file  . Emotionally Abused: Not on file  . Physically Abused: Not on file  . Sexually Abused: Not on file     Current Outpatient Medications:  .  amLODipine (NORVASC) 5 MG tablet, Take 5 mg by mouth daily. Specific dosage is unknown, Disp: , Rfl:  .  EPINEPHrine 0.3 mg/0.3 mL IJ SOAJ injection, Inject 0.3 mg into the muscle as needed., Disp: 1 each, Rfl: 3 .  zolpidem (AMBIEN) 10 MG tablet, Take 1 tablet (10 mg total) by mouth daily., Disp: 30 tablet, Rfl: 2   Allergies  Allergen Reactions  . Morphine And Related   . Pollen Extract Other (See Comments)    Nasal congestion    ROS Review of Systems  Constitutional: Negative.   HENT: Negative.   Respiratory: Negative.   Cardiovascular: Negative.   Neurological: Negative.   Hematological: Negative.   Psychiatric/Behavioral: Negative.      OBJECTIVE:    Physical Exam HENT:     Right Ear: Tympanic membrane normal.  Eyes:     Pupils: Pupils are equal, round, and reactive to light.  Cardiovascular:     Rate and Rhythm: Normal rate and regular rhythm.  Pulmonary:     Effort: Pulmonary effort is normal.  Musculoskeletal:     Cervical back: Normal range of motion.  Skin:    General: Skin is warm.  Neurological:     General: No focal deficit present.     Mental Status: She is alert.     BP (!) 154/108   Pulse (!) 130   Ht $R'5\' 4"'gZ$  (1.626 m)   Wt 190  lb 4.8 oz (86.3 kg)   BMI 32.66 kg/m  Wt Readings from Last 3 Encounters:  10/30/20 190 lb 4.8 oz (86.3 kg)  09/15/20 187 lb 1.6 oz (84.9 kg)  08/07/20 187 lb 9.6 oz (85.1 kg)    Health Maintenance Due  Topic Date Due  . Hepatitis C Screening  Never done  . COVID-19 Vaccine (1) Never done  . HIV Screening  Never done  . TETANUS/TDAP  Never done  . INFLUENZA VACCINE  Never done    There are no preventive care reminders to display for this patient.  No flowsheet data found. No flowsheet data found.  No results found for: TSH No results found for: ALBUMIN, ANIONGAP, EGFR, GFR No results found for: CHOL, HDL, LDLCALC, CHOLHDL No results found for: TRIG No results found for: HGBA1C    ASSESSMENT & PLAN:   Problem List Items Addressed This Visit      Cardiovascular and Mediastinum   Essential hypertension    HTN - Not taking meds x 3 months bc  Her BP has been wnl at home, today it is not well controlled. She will start meds back today.       Relevant Medications   EPINEPHrine 0.3 mg/0.3 mL IJ SOAJ injection     Other   Primary insomnia - Primary    Pt here today for Ambien rx, has been on the med for years. She gets 6 hours of broken sleep but feels really well in the am.          Meds ordered this encounter  Medications  . EPINEPHrine 0.3 mg/0.3 mL IJ SOAJ injection    Sig: Inject 0.3 mg into the muscle as needed.    Dispense:  1 each    Refill:  3  . zolpidem (AMBIEN) 10 MG tablet    Sig: Take 1 tablet (10 mg total) by mouth daily.    Dispense:  30 tablet    Refill:  2      Follow-up: No follow-ups on file.    [name], [place] [address], [place], [place] [address]

## 2020-10-30 NOTE — Assessment & Plan Note (Signed)
HTN - Not taking meds x 3 months bc  Her BP has been wnl at home, today it is not well controlled. She will start meds back today.

## 2020-11-09 ENCOUNTER — Ambulatory Visit: Payer: Federal, State, Local not specified - PPO | Admitting: Internal Medicine

## 2020-11-17 DIAGNOSIS — M546 Pain in thoracic spine: Secondary | ICD-10-CM | POA: Diagnosis not present

## 2020-11-17 DIAGNOSIS — M542 Cervicalgia: Secondary | ICD-10-CM | POA: Diagnosis not present

## 2020-11-17 DIAGNOSIS — N62 Hypertrophy of breast: Secondary | ICD-10-CM | POA: Diagnosis not present

## 2020-11-17 DIAGNOSIS — M25519 Pain in unspecified shoulder: Secondary | ICD-10-CM | POA: Diagnosis not present

## 2020-12-15 ENCOUNTER — Other Ambulatory Visit: Payer: Self-pay

## 2020-12-15 MED ORDER — AMLODIPINE BESYLATE 5 MG PO TABS
5.0000 mg | ORAL_TABLET | Freq: Every day | ORAL | 2 refills | Status: DC
Start: 2020-12-15 — End: 2021-02-26

## 2020-12-26 HISTORY — PX: REDUCTION MAMMAPLASTY: SUR839

## 2021-01-29 ENCOUNTER — Encounter: Payer: Self-pay | Admitting: Family Medicine

## 2021-01-29 ENCOUNTER — Ambulatory Visit (INDEPENDENT_AMBULATORY_CARE_PROVIDER_SITE_OTHER): Payer: Federal, State, Local not specified - PPO | Admitting: Family Medicine

## 2021-01-29 ENCOUNTER — Other Ambulatory Visit: Payer: Self-pay

## 2021-01-29 VITALS — BP 140/104 | HR 107 | Ht 65.0 in | Wt 198.1 lb

## 2021-01-29 DIAGNOSIS — Z79899 Other long term (current) drug therapy: Secondary | ICD-10-CM

## 2021-01-29 DIAGNOSIS — I10 Essential (primary) hypertension: Secondary | ICD-10-CM

## 2021-01-29 DIAGNOSIS — F5101 Primary insomnia: Secondary | ICD-10-CM | POA: Diagnosis not present

## 2021-01-29 MED ORDER — ZOLPIDEM TARTRATE 10 MG PO TABS
10.0000 mg | ORAL_TABLET | Freq: Every day | ORAL | 2 refills | Status: DC
Start: 2021-01-29 — End: 2021-01-29

## 2021-01-29 MED ORDER — ZOLPIDEM TARTRATE 10 MG PO TABS
10.0000 mg | ORAL_TABLET | Freq: Every day | ORAL | 2 refills | Status: DC
Start: 2021-01-29 — End: 2021-05-04

## 2021-01-29 MED ORDER — HYDROCHLOROTHIAZIDE 25 MG PO TABS: 25.0000 mg | ORAL_TABLET | Freq: Every day | ORAL | 3 refills | Status: DC

## 2021-01-29 NOTE — Assessment & Plan Note (Signed)
Patient's blood pressure is not within the desired range.  An office visit is recommended. Medication side effects include: headaches Medication changes per orders. Dietary sodium restriction.   Starting HCTZ 25 mg today, fu 1 month.

## 2021-01-29 NOTE — Progress Notes (Signed)
Established Patient Office Visit  SUBJECTIVE:  Subjective  Patient ID: Victoria Mason, female    DOB: 12-08-1981  Age: 40 y.o. MRN: [id number]  CC:  Chief Complaint  Patient presents with  . Follow-up    Patient is here for her 3 month blood pressure check     HPI Victoria Mason is a 40 y.o. female presenting today for     Past Medical History:  Diagnosis Date  . Dysmenorrhea   . History of hypertension    due to phentermine  . Seasonal allergies     Past Surgical History:  Procedure Laterality Date  . CESAREAN SECTION  02/05/2005  . CESAREAN SECTION  01/04/2008  . CHOLECYSTECTOMY    . DENTAL SURGERY     Dental implant   . LAPAROSCOPY    . TONSILLECTOMY    . TUBAL LIGATION  2009    Family History  Problem Relation Age of Onset  . Cancer Neg Hx   . Diabetes Neg Hx   . Hyperlipidemia Neg Hx   . Hypertension Neg Hx   . Stroke Neg Hx   . Breast cancer Neg Hx     Social History   Socioeconomic History  . Marital status: Married    Spouse name: Not on file  . Number of children: 3  . Years of education: Not on file  . Highest education level: Not on file  Occupational History  . Not on file  Tobacco Use  . Smoking status: Former Research scientist (life sciences)  . Smokeless tobacco: Never Used  Vaping Use  . Vaping Use: Never used  Substance and Sexual Activity  . Alcohol use: Yes    Comment: light  . Drug use: No  . Sexual activity: Yes    Birth control/protection: Surgical    Comment: Tubal ligation   Other Topics Concern  . Not on file  Social History Narrative  . Not on file   Social Determinants of Health   Financial Resource Strain: Not on file  Food Insecurity: Not on file  Transportation Needs: Not on file  Physical Activity: Not on file  Stress: Not on file  Social Connections: Not on file  Intimate Partner Violence: Not on file     Current Outpatient Medications:  .  hydrochlorothiazide (HYDRODIURIL) 25 MG tablet, Take 1 tablet (25 mg total) by mouth  daily., Disp: 90 tablet, Rfl: 3 .  amLODipine (NORVASC) 5 MG tablet, Take 1 tablet (5 mg total) by mouth daily. Specific dosage is unknown, Disp: 90 tablet, Rfl: 2 .  EPINEPHrine 0.3 mg/0.3 mL IJ SOAJ injection, Inject 0.3 mg into the muscle as needed., Disp: 1 each, Rfl: 3 .  zolpidem (AMBIEN) 10 MG tablet, Take 1 tablet (10 mg total) by mouth daily., Disp: 30 tablet, Rfl: 2   Allergies  Allergen Reactions  . Morphine And Related   . Pollen Extract Other (See Comments)    Nasal congestion    ROS Review of Systems  Constitutional: Negative.   HENT: Negative.   Respiratory: Negative.   Cardiovascular: Negative.   Genitourinary: Negative.   Musculoskeletal: Negative.   Neurological: Negative.   Psychiatric/Behavioral: Negative.      OBJECTIVE:    Physical Exam Constitutional:      Appearance: She is obese.  HENT:     Head: Normocephalic.     Mouth/Throat:     Mouth: Mucous membranes are moist.  Eyes:     Pupils: Pupils are equal, round, and reactive to light.  Cardiovascular:  Rate and Rhythm: Normal rate and regular rhythm.     Pulses: Normal pulses.  Musculoskeletal:        General: Normal range of motion.     Cervical back: Normal range of motion.  Skin:    General: Skin is warm.  Neurological:     General: No focal deficit present.  Psychiatric:        Mood and Affect: Mood normal.     BP (!) 140/104   Pulse (!) 107   Ht $R'5\' 5"'yA$  (1.651 m)   Wt 198 lb 1.6 oz (89.9 kg)   BMI 32.97 kg/m  Wt Readings from Last 3 Encounters:  01/29/21 198 lb 1.6 oz (89.9 kg)  10/30/20 190 lb 4.8 oz (86.3 kg)  09/15/20 187 lb 1.6 oz (84.9 kg)    Health Maintenance Due  Topic Date Due  . Hepatitis C Screening  Never done  . COVID-19 Vaccine (1) Never done  . HIV Screening  Never done  . TETANUS/TDAP  Never done  . INFLUENZA VACCINE  Never done    There are no preventive care reminders to display for this patient.  No flowsheet data found. No flowsheet data  found.  No results found for: TSH No results found for: ALBUMIN, ANIONGAP, EGFR, GFR No results found for: CHOL, HDL, LDLCALC, CHOLHDL No results found for: TRIG No results found for: HGBA1C    ASSESSMENT & PLAN:   Problem List Items Addressed This Visit      Cardiovascular and Mediastinum   Essential hypertension - Primary    Patient's blood pressure is not within the desired range.  An office visit is recommended. Medication side effects include: headaches Medication changes per orders. Dietary sodium restriction.   Starting HCTZ 25 mg today, fu 1 month.        Relevant Medications   hydrochlorothiazide (HYDRODIURIL) 25 MG tablet   Other Relevant Orders   CBC w/Diff/Platelet   Comprehensive Metabolic Panel (CMET)   Lipid Profile   TSH     Other   Primary insomnia    Patient doing well on Ambien, no daytime sleepiness.       Medication management    Medications reviewed today, starting new medication for HTN management.          Meds ordered this encounter  Medications  . hydrochlorothiazide (HYDRODIURIL) 25 MG tablet    Sig: Take 1 tablet (25 mg total) by mouth daily.    Dispense:  90 tablet    Refill:  3  . DISCONTD: zolpidem (AMBIEN) 10 MG tablet    Sig: Take 1 tablet (10 mg total) by mouth daily.    Dispense:  30 tablet    Refill:  2  . zolpidem (AMBIEN) 10 MG tablet    Sig: Take 1 tablet (10 mg total) by mouth daily.    Dispense:  30 tablet    Refill:  2      Follow-up: No follow-ups on file.    [name], [facility] [address] [place], [place] [address]

## 2021-01-29 NOTE — Assessment & Plan Note (Signed)
Patient doing well on Ambien, no daytime sleepiness.

## 2021-01-29 NOTE — Assessment & Plan Note (Signed)
Medications reviewed today, starting new medication for HTN management.

## 2021-01-30 ENCOUNTER — Other Ambulatory Visit: Payer: Self-pay | Admitting: Internal Medicine

## 2021-01-30 DIAGNOSIS — M9901 Segmental and somatic dysfunction of cervical region: Secondary | ICD-10-CM

## 2021-01-30 LAB — COMPREHENSIVE METABOLIC PANEL
AG Ratio: 1.7 (calc) (ref 1.0–2.5)
ALT: 18 U/L (ref 6–29)
AST: 20 U/L (ref 10–30)
Albumin: 4.3 g/dL (ref 3.6–5.1)
Alkaline phosphatase (APISO): 85 U/L (ref 31–125)
BUN: 9 mg/dL (ref 7–25)
CO2: 23 mmol/L (ref 20–32)
Calcium: 9.2 mg/dL (ref 8.6–10.2)
Chloride: 102 mmol/L (ref 98–110)
Creat: 0.66 mg/dL (ref 0.50–1.10)
Globulin: 2.6 g/dL (calc) (ref 1.9–3.7)
Glucose, Bld: 135 mg/dL — ABNORMAL HIGH (ref 65–99)
Potassium: 4.1 mmol/L (ref 3.5–5.3)
Sodium: 137 mmol/L (ref 135–146)
Total Bilirubin: 1.2 mg/dL (ref 0.2–1.2)
Total Protein: 6.9 g/dL (ref 6.1–8.1)

## 2021-01-30 LAB — CBC WITH DIFFERENTIAL/PLATELET
Absolute Monocytes: 591 cells/uL (ref 200–950)
Basophils Absolute: 37 cells/uL (ref 0–200)
Basophils Relative: 0.5 %
Eosinophils Absolute: 190 cells/uL (ref 15–500)
Eosinophils Relative: 2.6 %
HCT: 40.8 % (ref 35.0–45.0)
Hemoglobin: 14 g/dL (ref 11.7–15.5)
Lymphs Abs: 1789 cells/uL (ref 850–3900)
MCH: 31 pg (ref 27.0–33.0)
MCHC: 34.3 g/dL (ref 32.0–36.0)
MCV: 90.5 fL (ref 80.0–100.0)
MPV: 12.2 fL (ref 7.5–12.5)
Monocytes Relative: 8.1 %
Neutro Abs: 4694 cells/uL (ref 1500–7800)
Neutrophils Relative %: 64.3 %
Platelets: 182 10*3/uL (ref 140–400)
RBC: 4.51 10*6/uL (ref 3.80–5.10)
RDW: 12.7 % (ref 11.0–15.0)
Total Lymphocyte: 24.5 %
WBC: 7.3 10*3/uL (ref 3.8–10.8)

## 2021-01-30 LAB — LIPID PANEL
Cholesterol: 150 mg/dL (ref <200)
HDL: 41 mg/dL — ABNORMAL LOW (ref >=50)
LDL Cholesterol (Calc): 88 mg/dL (calc)
Non-HDL Cholesterol (Calc): 109 mg/dL (calc) (ref <130)
Total CHOL/HDL Ratio: 3.7 (calc) (ref <5.0)
Triglycerides: 115 mg/dL (ref <150)

## 2021-01-30 LAB — TSH: TSH: 0.88 mIU/L

## 2021-02-24 DIAGNOSIS — M542 Cervicalgia: Secondary | ICD-10-CM | POA: Diagnosis not present

## 2021-02-24 DIAGNOSIS — M546 Pain in thoracic spine: Secondary | ICD-10-CM | POA: Diagnosis not present

## 2021-02-24 DIAGNOSIS — N62 Hypertrophy of breast: Secondary | ICD-10-CM | POA: Diagnosis not present

## 2021-02-24 DIAGNOSIS — M25519 Pain in unspecified shoulder: Secondary | ICD-10-CM | POA: Diagnosis not present

## 2021-02-26 ENCOUNTER — Encounter: Payer: Self-pay | Admitting: Family Medicine

## 2021-02-26 ENCOUNTER — Other Ambulatory Visit: Payer: Self-pay

## 2021-02-26 ENCOUNTER — Ambulatory Visit (INDEPENDENT_AMBULATORY_CARE_PROVIDER_SITE_OTHER): Payer: Federal, State, Local not specified - PPO | Admitting: Family Medicine

## 2021-02-26 VITALS — BP 145/103 | HR 97 | Ht 65.0 in | Wt 194.9 lb

## 2021-02-26 DIAGNOSIS — E669 Obesity, unspecified: Secondary | ICD-10-CM | POA: Diagnosis not present

## 2021-02-26 DIAGNOSIS — I10 Essential (primary) hypertension: Secondary | ICD-10-CM | POA: Diagnosis not present

## 2021-02-26 MED ORDER — AMLODIPINE BESYLATE 10 MG PO TABS
5.0000 mg | ORAL_TABLET | Freq: Every day | ORAL | 3 refills | Status: DC
Start: 2021-02-26 — End: 2021-04-02

## 2021-02-26 NOTE — Assessment & Plan Note (Signed)
BP about the same today repeat manual confirmed 138/104 manual. I am going to increase Amlodapine to 10 mg daily. FU 1 month. Continue diet and exercioze.

## 2021-02-26 NOTE — Progress Notes (Signed)
Established Patient Office Visit  SUBJECTIVE:  Subjective  Patient ID: Victoria Mason, female    DOB: 03-20-1981  Age: 40 y.o. MRN: [id number]  CC:  Chief Complaint  Patient presents with  . Hypertension    HPI Victoria Mason is a 40 y.o. female presenting today for     Past Medical History:  Diagnosis Date  . Dysmenorrhea   . History of hypertension    due to phentermine  . Seasonal allergies     Past Surgical History:  Procedure Laterality Date  . CESAREAN SECTION  02/05/2005  . CESAREAN SECTION  01/04/2008  . CHOLECYSTECTOMY    . DENTAL SURGERY     Dental implant   . LAPAROSCOPY    . TONSILLECTOMY    . TUBAL LIGATION  2009    Family History  Problem Relation Age of Onset  . Cancer Neg Hx   . Diabetes Neg Hx   . Hyperlipidemia Neg Hx   . Hypertension Neg Hx   . Stroke Neg Hx   . Breast cancer Neg Hx     Social History   Socioeconomic History  . Marital status: Married    Spouse name: Not on file  . Number of children: 3  . Years of education: Not on file  . Highest education level: Not on file  Occupational History  . Not on file  Tobacco Use  . Smoking status: Former Research scientist (life sciences)  . Smokeless tobacco: Never Used  Vaping Use  . Vaping Use: Never used  Substance and Sexual Activity  . Alcohol use: Yes    Comment: light  . Drug use: No  . Sexual activity: Yes    Birth control/protection: Surgical    Comment: Tubal ligation   Other Topics Concern  . Not on file  Social History Narrative  . Not on file   Social Determinants of Health   Financial Resource Strain: Not on file  Food Insecurity: Not on file  Transportation Needs: Not on file  Physical Activity: Not on file  Stress: Not on file  Social Connections: Not on file  Intimate Partner Violence: Not on file     Current Outpatient Medications:  .  cyclobenzaprine (FLEXERIL) 10 MG tablet, TAKE 1 TABLET BY MOUTH EVERYDAY AT BEDTIME, Disp: 30 tablet, Rfl: 0 .  EPINEPHrine 0.3 mg/0.3 mL IJ  SOAJ injection, Inject 0.3 mg into the muscle as needed., Disp: 1 each, Rfl: 3 .  hydrochlorothiazide (HYDRODIURIL) 25 MG tablet, Take 1 tablet (25 mg total) by mouth daily., Disp: 90 tablet, Rfl: 3 .  zolpidem (AMBIEN) 10 MG tablet, Take 1 tablet (10 mg total) by mouth daily., Disp: 30 tablet, Rfl: 2 .  amLODipine (NORVASC) 10 MG tablet, Take 0.5 tablets (5 mg total) by mouth daily. Specific dosage is unknown, Disp: 30 tablet, Rfl: 3   Allergies  Allergen Reactions  . Morphine And Related   . Pollen Extract Other (See Comments)    Nasal congestion    ROS Review of Systems  Constitutional: Negative.   HENT: Negative.   Respiratory: Negative.   Cardiovascular: Negative.   Genitourinary: Negative.   Musculoskeletal: Negative.   Neurological: Negative.   Psychiatric/Behavioral: Negative.      OBJECTIVE:    Physical Exam Vitals and nursing note reviewed.  Constitutional:      Appearance: Normal appearance.  HENT:     Head: Normocephalic.  Cardiovascular:     Rate and Rhythm: Normal rate and regular rhythm.  Musculoskeletal:  General: Normal range of motion.  Skin:    General: Skin is warm.  Neurological:     General: No focal deficit present.     Mental Status: She is alert.  Psychiatric:        Mood and Affect: Mood normal.     BP (!) 145/103   Pulse 97   Ht _0  (1.651 m)   Wt 194 lb 14.4 oz (88.4 kg)   BMI 32.43 kg/m  Wt Readings from Last 3 Encounters:  02/26/21 194 lb 14.4 oz (88.4 kg)  01/29/21 198 lb 1.6 oz (89.9 kg)  10/30/20 190 lb 4.8 oz (86.3 kg)    Health Maintenance Due  Topic Date Due  . Hepatitis C Screening  Never done  . COVID-19 Vaccine (1) Never done  . HIV Screening  Never done  . TETANUS/TDAP  Never done  . INFLUENZA VACCINE  Never done    There are no preventive care reminders to display for this patient.  CBC Latest Ref Rng & Units 01/29/2021  WBC 3.8 - 10.8 Thousand/uL 7.3  Hemoglobin 11.7 - 15.5 g/dL 14.0  Hematocrit  35.0 - 45.0 % 40.8  Platelets 140 - 400 Thousand/uL 182   CMP Latest Ref Rng & Units 01/29/2021  Glucose 65 - 99 mg/dL 135(H)  BUN 7 - 25 mg/dL 9  Creatinine 0.50 - 1.10 mg/dL 0.66  Sodium 135 - 146 mmol/L 137  Potassium 3.5 - 5.3 mmol/L 4.1  Chloride 98 - 110 mmol/L 102  CO2 20 - 32 mmol/L 23  Calcium 8.6 - 10.2 mg/dL 9.2  Total Protein 6.1 - 8.1 g/dL 6.9  Total Bilirubin 0.2 - 1.2 mg/dL 1.2  AST 10 - 30 U/L 20  ALT 6 - 29 U/L 18    Lab Results  Component Value Date   TSH 0.88 01/29/2021   No results found for: ALBUMIN, ANIONGAP, EGFR, GFR Lab Results  Component Value Date   CHOL 150 01/29/2021   HDL 41 (L) 01/29/2021   LDLCALC 88 01/29/2021   CHOLHDL 3.7 01/29/2021   Lab Results  Component Value Date   TRIG 115 01/29/2021   No results found for: HGBA1C    ASSESSMENT & PLAN:   Problem List Items Addressed This Visit      Cardiovascular and Mediastinum   Essential hypertension    BP about the same today repeat manual confirmed 138/104 manual. I am going to increase Amlodapine to 10 mg daily. FU 1 month. Continue diet and exercioze.       Relevant Medications   amLODipine (NORVASC) 10 MG tablet     Other   Obesity (BMI 30-39.9) - Primary    Lost 4 lbs in 30 days, eating fish, watching na intake, continue diet and exercise.          Meds ordered this encounter  Medications  . amLODipine (NORVASC) 10 MG tablet    Sig: Take 0.5 tablets (5 mg total) by mouth daily. Specific dosage is unknown    Dispense:  30 tablet    Refill:  3      Follow-up: No follow-ups on file.    [name], [facility] [address] [place], [place] [address]

## 2021-02-26 NOTE — Assessment & Plan Note (Signed)
Lost 4 lbs in 30 days, eating fish, watching na intake, continue diet and exercise.

## 2021-03-15 DIAGNOSIS — N62 Hypertrophy of breast: Secondary | ICD-10-CM | POA: Diagnosis not present

## 2021-03-15 DIAGNOSIS — M546 Pain in thoracic spine: Secondary | ICD-10-CM | POA: Diagnosis not present

## 2021-03-15 DIAGNOSIS — M25519 Pain in unspecified shoulder: Secondary | ICD-10-CM | POA: Diagnosis not present

## 2021-03-15 DIAGNOSIS — M542 Cervicalgia: Secondary | ICD-10-CM | POA: Diagnosis not present

## 2021-03-26 ENCOUNTER — Ambulatory Visit: Payer: Federal, State, Local not specified - PPO | Admitting: Family Medicine

## 2021-04-02 ENCOUNTER — Ambulatory Visit (INDEPENDENT_AMBULATORY_CARE_PROVIDER_SITE_OTHER): Payer: Federal, State, Local not specified - PPO | Admitting: Family Medicine

## 2021-04-02 ENCOUNTER — Other Ambulatory Visit: Payer: Self-pay

## 2021-04-02 ENCOUNTER — Encounter: Payer: Self-pay | Admitting: Family Medicine

## 2021-04-02 VITALS — BP 142/82 | HR 93 | Ht 65.0 in | Wt 193.4 lb

## 2021-04-02 DIAGNOSIS — I10 Essential (primary) hypertension: Secondary | ICD-10-CM

## 2021-04-02 MED ORDER — LOSARTAN POTASSIUM 50 MG PO TABS: 50.0000 mg | ORAL_TABLET | Freq: Every day | ORAL | 3 refills | Status: DC

## 2021-04-02 NOTE — Progress Notes (Addendum)
Established Patient Office Visit  SUBJECTIVE:  Subjective  Patient ID: Victoria Mason, female    DOB: 1981/08/08  Age: 40 y.o. MRN: [id number]  CC:  Chief Complaint  Patient presents with  . Follow-up    Pt here for 1 month follow up for hypertension.     HPI Victoria Mason is a 40 y.o. female presenting today for     Past Medical History:  Diagnosis Date  . Dysmenorrhea   . History of hypertension    due to phentermine  . Seasonal allergies     Past Surgical History:  Procedure Laterality Date  . CESAREAN SECTION  02/05/2005  . CESAREAN SECTION  01/04/2008  . CHOLECYSTECTOMY    . DENTAL SURGERY     Dental implant   . LAPAROSCOPY    . TONSILLECTOMY    . TUBAL LIGATION  2009    Family History  Problem Relation Age of Onset  . Cancer Neg Hx   . Diabetes Neg Hx   . Hyperlipidemia Neg Hx   . Hypertension Neg Hx   . Stroke Neg Hx   . Breast cancer Neg Hx     Social History   Socioeconomic History  . Marital status: Married    Spouse name: Not on file  . Number of children: 3  . Years of education: Not on file  . Highest education level: Not on file  Occupational History  . Not on file  Tobacco Use  . Smoking status: Former Research scientist (life sciences)  . Smokeless tobacco: Never Used  Vaping Use  . Vaping Use: Never used  Substance and Sexual Activity  . Alcohol use: Yes    Comment: light  . Drug use: No  . Sexual activity: Yes    Birth control/protection: Surgical    Comment: Tubal ligation   Other Topics Concern  . Not on file  Social History Narrative  . Not on file   Social Determinants of Health   Financial Resource Strain: Not on file  Food Insecurity: Not on file  Transportation Needs: Not on file  Physical Activity: Not on file  Stress: Not on file  Social Connections: Not on file  Intimate Partner Violence: Not on file     Current Outpatient Medications:  .  losartan (COZAAR) 50 MG tablet, Take 1 tablet (50 mg total) by mouth daily., Disp: 90  tablet, Rfl: 3 .  cyclobenzaprine (FLEXERIL) 10 MG tablet, TAKE 1 TABLET BY MOUTH EVERYDAY AT BEDTIME, Disp: 30 tablet, Rfl: 0 .  EPINEPHrine 0.3 mg/0.3 mL IJ SOAJ injection, Inject 0.3 mg into the muscle as needed., Disp: 1 each, Rfl: 3 .  hydrochlorothiazide (HYDRODIURIL) 25 MG tablet, Take 1 tablet (25 mg total) by mouth daily., Disp: 90 tablet, Rfl: 3 .  zolpidem (AMBIEN) 10 MG tablet, Take 1 tablet (10 mg total) by mouth daily., Disp: 30 tablet, Rfl: 2   Allergies  Allergen Reactions  . Ace Inhibitors Anaphylaxis  . Morphine And Related   . Pollen Extract Other (See Comments)    Nasal congestion    ROS Review of Systems  Constitutional: Negative.   HENT: Negative.   Respiratory: Negative.   Gastrointestinal: Negative.   Skin: Negative.   Neurological: Negative.   Psychiatric/Behavioral: Negative.      OBJECTIVE:    Physical Exam Vitals and nursing note reviewed.  Constitutional:      Appearance: Normal appearance. She is obese.  HENT:     Right Ear: Tympanic membrane normal.  Left Ear: Tympanic membrane normal.  Eyes:     Pupils: Pupils are equal, round, and reactive to light.  Cardiovascular:     Rate and Rhythm: Normal rate.  Pulmonary:     Effort: Pulmonary effort is normal.  Musculoskeletal:     Cervical back: Normal range of motion.  Skin:    General: Skin is warm.  Neurological:     Mental Status: She is alert.  Psychiatric:        Mood and Affect: Mood normal.     BP (!) 142/82   Pulse 93   Ht $R'5\' 5"'iu$  (1.651 m)   Wt 193 lb 6.4 oz (87.7 kg)   BMI 32.18 kg/m  Wt Readings from Last 3 Encounters:  04/02/21 193 lb 6.4 oz (87.7 kg)  02/26/21 194 lb 14.4 oz (88.4 kg)  01/29/21 198 lb 1.6 oz (89.9 kg)    Health Maintenance Due  Topic Date Due  . Hepatitis C Screening  Never done  . COVID-19 Vaccine (1) Never done  . HIV Screening  Never done  . TETANUS/TDAP  Never done  . PAP SMEAR-Modifier  05/30/2021    There are no preventive care  reminders to display for this patient.  CBC Latest Ref Rng & Units 01/29/2021  WBC 3.8 - 10.8 Thousand/uL 7.3  Hemoglobin 11.7 - 15.5 g/dL 14.0  Hematocrit 35.0 - 45.0 % 40.8  Platelets 140 - 400 Thousand/uL 182   CMP Latest Ref Rng & Units 01/29/2021  Glucose 65 - 99 mg/dL 135(H)  BUN 7 - 25 mg/dL 9  Creatinine 0.50 - 1.10 mg/dL 0.66  Sodium 135 - 146 mmol/L 137  Potassium 3.5 - 5.3 mmol/L 4.1  Chloride 98 - 110 mmol/L 102  CO2 20 - 32 mmol/L 23  Calcium 8.6 - 10.2 mg/dL 9.2  Total Protein 6.1 - 8.1 g/dL 6.9  Total Bilirubin 0.2 - 1.2 mg/dL 1.2  AST 10 - 30 U/L 20  ALT 6 - 29 U/L 18    Lab Results  Component Value Date   TSH 0.88 01/29/2021   No results found for: ALBUMIN, ANIONGAP, EGFR, GFR Lab Results  Component Value Date   CHOL 150 01/29/2021   HDL 41 (L) 01/29/2021   LDLCALC 88 01/29/2021   CHOLHDL 3.7 01/29/2021   Lab Results  Component Value Date   TRIG 115 01/29/2021   No results found for: HGBA1C    ASSESSMENT & PLAN:   Problem List Items Addressed This Visit      Cardiovascular and Mediastinum   Essential hypertension - Primary    Patients BP is at goal but unfortunately she is having significant swelling in knee's and ankles with the 10 mg of Amlodipine.   Plan- She had anaphylaxis with Lisinopril so I am starting her on Losartan 50 mg daily, fu 2 months.       Relevant Medications   losartan (COZAAR) 50 MG tablet      Meds ordered this encounter  Medications  . losartan (COZAAR) 50 MG tablet    Sig: Take 1 tablet (50 mg total) by mouth daily.    Dispense:  90 tablet    Refill:  3      Follow-up: No follow-ups on file.    [name], [facility] [address] [place], [place] [address]

## 2021-04-02 NOTE — Assessment & Plan Note (Signed)
Patients BP is at goal but unfortunately she is having significant swelling in knee's and ankles with the 10 mg of Amlodipine.   Plan- She had anaphylaxis with Lisinopril so I am starting her on Losartan 50 mg daily, fu 2 months.

## 2021-05-04 ENCOUNTER — Other Ambulatory Visit: Payer: Self-pay | Admitting: *Deleted

## 2021-05-04 ENCOUNTER — Other Ambulatory Visit: Payer: Self-pay | Admitting: Family Medicine

## 2021-05-04 MED ORDER — ZOLPIDEM TARTRATE 10 MG PO TABS: 10.0000 mg | ORAL_TABLET | Freq: Every day | ORAL | 2 refills | Status: DC

## 2021-06-04 ENCOUNTER — Encounter: Payer: Self-pay | Admitting: Family Medicine

## 2021-06-04 ENCOUNTER — Other Ambulatory Visit: Payer: Self-pay

## 2021-06-04 ENCOUNTER — Ambulatory Visit: Payer: Federal, State, Local not specified - PPO | Admitting: Family Medicine

## 2021-06-04 VITALS — BP 128/80 | HR 84 | Ht 65.0 in | Wt 180.8 lb

## 2021-06-04 DIAGNOSIS — I10 Essential (primary) hypertension: Secondary | ICD-10-CM | POA: Diagnosis not present

## 2021-06-04 MED ORDER — ZOLPIDEM TARTRATE 10 MG PO TABS: 10.0000 mg | ORAL_TABLET | Freq: Every day | ORAL | 2 refills | Status: DC

## 2021-06-04 NOTE — Addendum Note (Signed)
Addended by: Irish Lack on: 06/04/2021 10:58 AM   Modules accepted: Orders

## 2021-06-04 NOTE — Progress Notes (Signed)
Established Patient Office Visit  SUBJECTIVE:  Subjective  Patient ID: Victoria Mason, female    DOB: 05-27-1981  Age: 40 y.o. MRN: [id number]  CC:  Chief Complaint  Patient presents with   Hypertension    HPI Victoria Mason is a 40 y.o. female presenting today for htn re eval, all new meds tolerated well.    Past Medical History:  Diagnosis Date   Dysmenorrhea    History of hypertension    due to phentermine   Seasonal allergies     Past Surgical History:  Procedure Laterality Date   CESAREAN SECTION  02/05/2005   CESAREAN SECTION  01/04/2008   CHOLECYSTECTOMY     DENTAL SURGERY     Dental implant    LAPAROSCOPY     TONSILLECTOMY     TUBAL LIGATION  2009    Family History  Problem Relation Age of Onset   Cancer Neg Hx    Diabetes Neg Hx    Hyperlipidemia Neg Hx    Hypertension Neg Hx    Stroke Neg Hx    Breast cancer Neg Hx     Social History   Socioeconomic History   Marital status: Married    Spouse name: Not on file   Number of children: 3   Years of education: Not on file   Highest education level: Not on file  Occupational History   Not on file  Tobacco Use   Smoking status: Former    Pack years: 0.00   Smokeless tobacco: Never  Vaping Use   Vaping Use: Never used  Substance and Sexual Activity   Alcohol use: Yes    Comment: light   Drug use: No   Sexual activity: Yes    Birth control/protection: Surgical    Comment: Tubal ligation   Other Topics Concern   Not on file  Social History Narrative   Not on file   Social Determinants of Health   Financial Resource Strain: Not on file  Food Insecurity: Not on file  Transportation Needs: Not on file  Physical Activity: Not on file  Stress: Not on file  Social Connections: Not on file  Intimate Partner Violence: Not on file     Current Outpatient Medications:    cyclobenzaprine (FLEXERIL) 10 MG tablet, TAKE 1 TABLET BY MOUTH EVERYDAY AT BEDTIME, Disp: 30 tablet, Rfl: 0   EPINEPHrine  0.3 mg/0.3 mL IJ SOAJ injection, Inject 0.3 mg into the muscle as needed., Disp: 1 each, Rfl: 3   hydrochlorothiazide (HYDRODIURIL) 25 MG tablet, Take 1 tablet (25 mg total) by mouth daily., Disp: 90 tablet, Rfl: 3   losartan (COZAAR) 50 MG tablet, Take 1 tablet (50 mg total) by mouth daily., Disp: 90 tablet, Rfl: 3   zolpidem (AMBIEN) 10 MG tablet, Take 1 tablet (10 mg total) by mouth daily., Disp: 30 tablet, Rfl: 2   Allergies  Allergen Reactions   Ace Inhibitors Anaphylaxis   Morphine And Related    Pollen Extract Other (See Comments)    Nasal congestion    ROS Review of Systems  Constitutional: Negative.   HENT: Negative.    Respiratory: Negative.    Cardiovascular: Negative.   Genitourinary: Negative.   Neurological: Negative.   Psychiatric/Behavioral: Negative.      OBJECTIVE:    Physical Exam Constitutional:      Appearance: She is obese.  Cardiovascular:     Rate and Rhythm: Normal rate and regular rhythm.  Pulmonary:     Effort: Pulmonary effort  is normal.  Abdominal:     General: Abdomen is flat.  Psychiatric:        Mood and Affect: Mood normal.    BP 128/80   Pulse 84   Ht $R'5\' 5"'RW$  (1.651 m)   Wt 180 lb 12.8 oz (82 kg)   BMI 30.09 kg/m  Wt Readings from Last 3 Encounters:  06/04/21 180 lb 12.8 oz (82 kg)  04/02/21 193 lb 6.4 oz (87.7 kg)  02/26/21 194 lb 14.4 oz (88.4 kg)    Health Maintenance Due  Topic Date Due   COVID-19 Vaccine (1) Never done   Pneumococcal Vaccine 14-61 Years old (1 - PCV) Never done   HIV Screening  Never done   Hepatitis C Screening  Never done   TETANUS/TDAP  Never done   Zoster Vaccines- Shingrix (1 of 2) Never done   PAP SMEAR-Modifier  05/30/2021    There are no preventive care reminders to display for this patient.  CBC Latest Ref Rng & Units 01/29/2021  WBC 3.8 - 10.8 Thousand/uL 7.3  Hemoglobin 11.7 - 15.5 g/dL 14.0  Hematocrit 35.0 - 45.0 % 40.8  Platelets 140 - 400 Thousand/uL 182   CMP Latest Ref Rng & Units  01/29/2021  Glucose 65 - 99 mg/dL 135(H)  BUN 7 - 25 mg/dL 9  Creatinine 0.50 - 1.10 mg/dL 0.66  Sodium 135 - 146 mmol/L 137  Potassium 3.5 - 5.3 mmol/L 4.1  Chloride 98 - 110 mmol/L 102  CO2 20 - 32 mmol/L 23  Calcium 8.6 - 10.2 mg/dL 9.2  Total Protein 6.1 - 8.1 g/dL 6.9  Total Bilirubin 0.2 - 1.2 mg/dL 1.2  AST 10 - 30 U/L 20  ALT 6 - 29 U/L 18    Lab Results  Component Value Date   TSH 0.88 01/29/2021   No results found for: ALBUMIN, ANIONGAP, EGFR, GFR Lab Results  Component Value Date   CHOL 150 01/29/2021   HDL 41 (L) 01/29/2021   LDLCALC 88 01/29/2021   CHOLHDL 3.7 01/29/2021   Lab Results  Component Value Date   TRIG 115 01/29/2021   No results found for: HGBA1C    ASSESSMENT & PLAN:   Problem List Items Addressed This Visit       Cardiovascular and Mediastinum   Essential hypertension - Primary    Patient's blood pressure is within the desired range. Medication side effects include: no side effects noted Continue current treatment regimen. Continue current medications. Dietary sodium restriction.   Patient trying to restrict sodium, she has lost 14 lbs since starting the HCTZ and lowering sodium.          No orders of the defined types were placed in this encounter.     Follow-up: No follow-ups on file.    [name], [facility] [address] [place], [place] [address]

## 2021-06-04 NOTE — Assessment & Plan Note (Signed)
Patient's blood pressure is within the desired range. Medication side effects include: no side effects noted Continue current treatment regimen. Continue current medications. Dietary sodium restriction.   Patient trying to restrict sodium, she has lost 14 lbs since starting the HCTZ and lowering sodium.

## 2021-07-05 ENCOUNTER — Other Ambulatory Visit: Payer: Self-pay | Admitting: *Deleted

## 2021-07-05 MED ORDER — CLOBETASOL PROPIONATE 0.05 % EX CREA: 1.0000 "application " | TOPICAL_CREAM | Freq: Two times a day (BID) | CUTANEOUS | 4 refills | Status: DC

## 2021-08-16 ENCOUNTER — Other Ambulatory Visit: Payer: Self-pay | Admitting: *Deleted

## 2021-08-16 MED ORDER — ZOLPIDEM TARTRATE 10 MG PO TABS: 10.0000 mg | ORAL_TABLET | Freq: Every day | ORAL | 2 refills | Status: DC

## 2021-08-17 DIAGNOSIS — Z48817 Encounter for surgical aftercare following surgery on the skin and subcutaneous tissue: Secondary | ICD-10-CM | POA: Diagnosis not present

## 2021-08-17 DIAGNOSIS — N62 Hypertrophy of breast: Secondary | ICD-10-CM | POA: Diagnosis not present

## 2021-09-13 ENCOUNTER — Ambulatory Visit: Payer: Federal, State, Local not specified - PPO | Admitting: Internal Medicine

## 2021-09-20 ENCOUNTER — Ambulatory Visit: Payer: Federal, State, Local not specified - PPO | Admitting: Internal Medicine

## 2021-09-20 ENCOUNTER — Other Ambulatory Visit: Payer: Self-pay

## 2021-09-20 ENCOUNTER — Encounter: Payer: Self-pay | Admitting: Internal Medicine

## 2021-09-20 VITALS — BP 170/108 | HR 82 | Ht 65.0 in | Wt 202.0 lb

## 2021-09-20 DIAGNOSIS — Z23 Encounter for immunization: Secondary | ICD-10-CM | POA: Diagnosis not present

## 2021-09-20 DIAGNOSIS — F329 Major depressive disorder, single episode, unspecified: Secondary | ICD-10-CM

## 2021-09-20 DIAGNOSIS — E669 Obesity, unspecified: Secondary | ICD-10-CM | POA: Diagnosis not present

## 2021-09-20 DIAGNOSIS — L409 Psoriasis, unspecified: Secondary | ICD-10-CM | POA: Diagnosis not present

## 2021-09-20 DIAGNOSIS — I10 Essential (primary) hypertension: Secondary | ICD-10-CM

## 2021-09-20 DIAGNOSIS — F5101 Primary insomnia: Secondary | ICD-10-CM | POA: Diagnosis not present

## 2021-09-20 MED ORDER — BUPROPION HCL ER (SR) 150 MG PO TB12: 150.0000 mg | ORAL_TABLET | Freq: Two times a day (BID) | ORAL | 4 refills | Status: DC

## 2021-09-20 NOTE — Assessment & Plan Note (Signed)
Stable at the present time. 

## 2021-09-20 NOTE — Assessment & Plan Note (Signed)

## 2021-09-20 NOTE — Progress Notes (Signed)
Established Patient Office Visit  Subjective:  Patient ID: Victoria Mason, female    DOB: 1981/05/25  Age: 40 y.o. MRN: [id number]  CC:  Chief Complaint  Patient presents with   Follow-up    Depression        This is a chronic problem.  The current episode started more than 1 year ago.   The problem occurs daily.  Associated symptoms include decreased concentration, fatigue, helplessness, hopelessness, insomnia, irritable, restlessness, decreased interest, appetite change and body aches.  Associated symptoms include no suicidal ideas.     The symptoms are aggravated by family issues.  Past medical history includes .     Pertinent negatives include no chronic pain and no hypothyroidism.  Victoria Mason presents for check up  Past Medical History:  Diagnosis Date   Dysmenorrhea    History of hypertension    due to phentermine   Seasonal allergies     Past Surgical History:  Procedure Laterality Date   CESAREAN SECTION  02/05/2005   CESAREAN SECTION  01/04/2008   CHOLECYSTECTOMY     DENTAL SURGERY     Dental implant    LAPAROSCOPY     TONSILLECTOMY     TUBAL LIGATION  2009    Family History  Problem Relation Age of Onset   Cancer Neg Hx    Diabetes Neg Hx    Hyperlipidemia Neg Hx    Hypertension Neg Hx    Stroke Neg Hx    Breast cancer Neg Hx     Social History   Socioeconomic History   Marital status: Married    Spouse name: Not on file   Number of children: 3   Years of education: Not on file   Highest education level: Not on file  Occupational History   Not on file  Tobacco Use   Smoking status: Former   Smokeless tobacco: Never  Vaping Use   Vaping Use: Never used  Substance and Sexual Activity   Alcohol use: Yes    Comment: light   Drug use: No   Sexual activity: Yes    Birth control/protection: Surgical    Comment: Tubal ligation   Other Topics Concern   Not on file  Social History Narrative   Not on file   Social Determinants of Health    Financial Resource Strain: Not on file  Food Insecurity: Not on file  Transportation Needs: Not on file  Physical Activity: Not on file  Stress: Not on file  Social Connections: Not on file  Intimate Partner Violence: Not on file     Current Outpatient Medications:    clobetasol cream (TEMOVATE) 0.05 %, Apply 1 application topically 2 (two) times daily., Disp: 45 g, Rfl: 4   cyclobenzaprine (FLEXERIL) 10 MG tablet, TAKE 1 TABLET BY MOUTH EVERYDAY AT BEDTIME, Disp: 30 tablet, Rfl: 0   EPINEPHrine 0.3 mg/0.3 mL IJ SOAJ injection, Inject 0.3 mg into the muscle as needed., Disp: 1 each, Rfl: 3   hydrochlorothiazide (HYDRODIURIL) 25 MG tablet, Take 1 tablet (25 mg total) by mouth daily., Disp: 90 tablet, Rfl: 3   losartan (COZAAR) 50 MG tablet, Take 1 tablet (50 mg total) by mouth daily., Disp: 90 tablet, Rfl: 3   zolpidem (AMBIEN) 10 MG tablet, Take 1 tablet (10 mg total) by mouth daily., Disp: 30 tablet, Rfl: 2   Allergies  Allergen Reactions   Ace Inhibitors Anaphylaxis   Morphine And Related    Pollen Extract Other (See Comments)  Nasal congestion    ROS Review of Systems  Constitutional:  Positive for appetite change and fatigue.  HENT: Negative.    Eyes: Negative.   Respiratory: Negative.    Cardiovascular: Negative.   Gastrointestinal: Negative.   Endocrine: Negative.   Genitourinary: Negative.   Musculoskeletal: Negative.   Skin: Negative.   Allergic/Immunologic: Negative.   Neurological: Negative.   Hematological: Negative.   Psychiatric/Behavioral:  Positive for decreased concentration and depression. Negative for suicidal ideas. The patient has insomnia.   All other systems reviewed and are negative.    Objective:    Physical Exam Constitutional:      General: She is irritable.    There were no vitals taken for this visit. Wt Readings from Last 3 Encounters:  06/04/21 180 lb 12.8 oz (82 kg)  04/02/21 193 lb 6.4 oz (87.7 kg)  02/26/21 194 lb 14.4 oz  (88.4 kg)     Health Maintenance Due  Topic Date Due   COVID-19 Vaccine (1) Never done   Hepatitis C Screening  Never done   TETANUS/TDAP  Never done   INFLUENZA VACCINE  Never done    There are no preventive care reminders to display for this patient.  Lab Results  Component Value Date   TSH 0.88 01/29/2021   Lab Results  Component Value Date   WBC 7.3 01/29/2021   HGB 14.0 01/29/2021   HCT 40.8 01/29/2021   MCV 90.5 01/29/2021   PLT 182 01/29/2021   Lab Results  Component Value Date   NA 137 01/29/2021   K 4.1 01/29/2021   CO2 23 01/29/2021   GLUCOSE 135 (H) 01/29/2021   BUN 9 01/29/2021   CREATININE 0.66 01/29/2021   BILITOT 1.2 01/29/2021   AST 20 01/29/2021   ALT 18 01/29/2021   PROT 6.9 01/29/2021   CALCIUM 9.2 01/29/2021   Lab Results  Component Value Date   CHOL 150 01/29/2021   Lab Results  Component Value Date   HDL 41 (L) 01/29/2021   Lab Results  Component Value Date   LDLCALC 88 01/29/2021   Lab Results  Component Value Date   TRIG 115 01/29/2021   Lab Results  Component Value Date   CHOLHDL 3.7 01/29/2021   No results found for: HGBA1C    Assessment & Plan:   Problem List Items Addressed This Visit   None   No orders of the defined types were placed in this encounter.   Follow-up: No follow-ups on file.    Corky Downs, MD

## 2021-09-20 NOTE — Assessment & Plan Note (Signed)
Patient was started on Wellbutrin 150 mg p.o. twice a day

## 2021-09-20 NOTE — Assessment & Plan Note (Signed)

## 2021-09-21 ENCOUNTER — Ambulatory Visit: Payer: Federal, State, Local not specified - PPO | Admitting: Internal Medicine

## 2021-10-12 ENCOUNTER — Other Ambulatory Visit: Payer: Self-pay | Admitting: Internal Medicine

## 2021-10-12 DIAGNOSIS — F329 Major depressive disorder, single episode, unspecified: Secondary | ICD-10-CM

## 2021-10-18 ENCOUNTER — Other Ambulatory Visit: Payer: Self-pay

## 2021-10-18 ENCOUNTER — Encounter: Payer: Self-pay | Admitting: Internal Medicine

## 2021-10-18 ENCOUNTER — Ambulatory Visit: Payer: Federal, State, Local not specified - PPO | Admitting: Internal Medicine

## 2021-10-18 VITALS — BP 156/111 | HR 106 | Ht 65.0 in | Wt 197.1 lb

## 2021-10-18 DIAGNOSIS — F329 Major depressive disorder, single episode, unspecified: Secondary | ICD-10-CM

## 2021-10-18 DIAGNOSIS — E669 Obesity, unspecified: Secondary | ICD-10-CM

## 2021-10-18 DIAGNOSIS — I10 Essential (primary) hypertension: Secondary | ICD-10-CM

## 2021-10-18 DIAGNOSIS — L409 Psoriasis, unspecified: Secondary | ICD-10-CM

## 2021-10-18 DIAGNOSIS — M9901 Segmental and somatic dysfunction of cervical region: Secondary | ICD-10-CM

## 2021-10-18 MED ORDER — LOSARTAN POTASSIUM 50 MG PO TABS: 100.0000 mg | ORAL_TABLET | Freq: Every day | ORAL | 3 refills | Status: DC

## 2021-10-18 NOTE — Assessment & Plan Note (Signed)
No complaints today.

## 2021-10-18 NOTE — Assessment & Plan Note (Signed)
Stable at the present time. 

## 2021-10-18 NOTE — Assessment & Plan Note (Signed)
Patient is feeling much better

## 2021-10-18 NOTE — Assessment & Plan Note (Signed)
We will increase her losartan to 50 mg 2 tablets p.o. daily

## 2021-10-18 NOTE — Progress Notes (Signed)
Established Patient Office Visit  Subjective:  Patient ID: Victoria Mason, female    DOB: 1981-06-26  Age: 40 y.o. MRN: [id number]  CC:  Chief Complaint  Patient presents with   Follow-up    1 Month follow up    HPI  Victoria Mason presents for general check up  Past Medical History:  Diagnosis Date   Dysmenorrhea    History of hypertension    due to phentermine   Seasonal allergies     Past Surgical History:  Procedure Laterality Date   CESAREAN SECTION  02/05/2005   CESAREAN SECTION  01/04/2008   CHOLECYSTECTOMY     DENTAL SURGERY     Dental implant    LAPAROSCOPY     TONSILLECTOMY     TUBAL LIGATION  2009    Family History  Problem Relation Age of Onset   Cancer Neg Hx    Diabetes Neg Hx    Hyperlipidemia Neg Hx    Hypertension Neg Hx    Stroke Neg Hx    Breast cancer Neg Hx     Social History   Socioeconomic History   Marital status: Married    Spouse name: Not on file   Number of children: 3   Years of education: Not on file   Highest education level: Not on file  Occupational History   Not on file  Tobacco Use   Smoking status: Former   Smokeless tobacco: Never  Vaping Use   Vaping Use: Never used  Substance and Sexual Activity   Alcohol use: Yes    Comment: light   Drug use: No   Sexual activity: Yes    Birth control/protection: Surgical    Comment: Tubal ligation   Other Topics Concern   Not on file  Social History Narrative   Not on file   Social Determinants of Health   Financial Resource Strain: Not on file  Food Insecurity: Not on file  Transportation Needs: Not on file  Physical Activity: Not on file  Stress: Not on file  Social Connections: Not on file  Intimate Partner Violence: Not on file     Current Outpatient Medications:    buPROPion (WELLBUTRIN SR) 150 MG 12 hr tablet, TAKE 1 TABLET BY MOUTH TWICE A DAY, Disp: 180 tablet, Rfl: 2   clobetasol cream (TEMOVATE) 0.05 %, Apply 1 application topically 2 (two) times  daily., Disp: 45 g, Rfl: 4   cyclobenzaprine (FLEXERIL) 10 MG tablet, TAKE 1 TABLET BY MOUTH EVERYDAY AT BEDTIME, Disp: 30 tablet, Rfl: 0   EPINEPHrine 0.3 mg/0.3 mL IJ SOAJ injection, Inject 0.3 mg into the muscle as needed., Disp: 1 each, Rfl: 3   hydrochlorothiazide (HYDRODIURIL) 25 MG tablet, Take 1 tablet (25 mg total) by mouth daily., Disp: 90 tablet, Rfl: 3   losartan (COZAAR) 50 MG tablet, Take 1 tablet (50 mg total) by mouth daily., Disp: 90 tablet, Rfl: 3   zolpidem (AMBIEN) 10 MG tablet, Take 1 tablet (10 mg total) by mouth daily., Disp: 30 tablet, Rfl: 2   Allergies  Allergen Reactions   Ace Inhibitors Anaphylaxis   Morphine And Related    Pollen Extract Other (See Comments)    Nasal congestion    ROS Review of Systems  Constitutional: Negative.   HENT: Negative.    Eyes: Negative.   Respiratory: Negative.    Cardiovascular: Negative.   Gastrointestinal: Negative.   Endocrine: Negative.   Genitourinary: Negative.   Musculoskeletal: Negative.   Skin: Negative.  Allergic/Immunologic: Negative.   Neurological: Negative.   Hematological: Negative.   Psychiatric/Behavioral: Negative.    All other systems reviewed and are negative.    Objective:    Physical Exam Vitals reviewed.  Constitutional:      Appearance: Normal appearance.  HENT:     Mouth/Throat:     Mouth: Mucous membranes are moist.  Eyes:     Pupils: Pupils are equal, round, and reactive to light.  Neck:     Vascular: No carotid bruit.  Cardiovascular:     Rate and Rhythm: Normal rate and regular rhythm.     Pulses: Normal pulses.     Heart sounds: Normal heart sounds.  Pulmonary:     Effort: Pulmonary effort is normal.     Breath sounds: Normal breath sounds.  Abdominal:     General: Bowel sounds are normal.     Palpations: Abdomen is soft. There is no hepatomegaly, splenomegaly or mass.     Tenderness: There is no abdominal tenderness.     Hernia: No hernia is present.  Musculoskeletal:         General: No tenderness.     Cervical back: Neck supple.     Right lower leg: No edema.     Left lower leg: No edema.  Skin:    Findings: No rash.  Neurological:     Mental Status: She is alert and oriented to person, place, and time.     Motor: No weakness.  Psychiatric:        Mood and Affect: Mood and affect normal.        Behavior: Behavior normal.    BP (!) 156/111   Pulse (!) 106   Ht 5\' 5"  (1.651 m)   Wt 197 lb 1.6 oz (89.4 kg)   BMI 32.80 kg/m  Wt Readings from Last 3 Encounters:  10/18/21 197 lb 1.6 oz (89.4 kg)  09/20/21 202 lb (91.6 kg)  06/04/21 180 lb 12.8 oz (82 kg)     Health Maintenance Due  Topic Date Due   COVID-19 Vaccine (1) Never done   Pneumococcal Vaccine 14-73 Years old (1 - PCV) Never done   Hepatitis C Screening  Never done   TETANUS/TDAP  Never done   PAP SMEAR-Modifier  05/30/2021    There are no preventive care reminders to display for this patient.  Lab Results  Component Value Date   TSH 0.88 01/29/2021   Lab Results  Component Value Date   WBC 7.3 01/29/2021   HGB 14.0 01/29/2021   HCT 40.8 01/29/2021   MCV 90.5 01/29/2021   PLT 182 01/29/2021   Lab Results  Component Value Date   NA 137 01/29/2021   K 4.1 01/29/2021   CO2 23 01/29/2021   GLUCOSE 135 (H) 01/29/2021   BUN 9 01/29/2021   CREATININE 0.66 01/29/2021   BILITOT 1.2 01/29/2021   AST 20 01/29/2021   ALT 18 01/29/2021   PROT 6.9 01/29/2021   CALCIUM 9.2 01/29/2021   Lab Results  Component Value Date   CHOL 150 01/29/2021   Lab Results  Component Value Date   HDL 41 (L) 01/29/2021   Lab Results  Component Value Date   LDLCALC 88 01/29/2021   Lab Results  Component Value Date   TRIG 115 01/29/2021   Lab Results  Component Value Date   CHOLHDL 3.7 01/29/2021   No results found for: HGBA1C    Assessment & Plan:   Problem List Items Addressed This Visit  Cardiovascular and Mediastinum   Essential hypertension    We will increase  her losartan to 50 mg 2 tablets p.o. daily        Musculoskeletal and Integument   Psoriasis and similar disorder    Stable at the present time        Other   Obesity (BMI 30-39.9)    - I encouraged the patient to lose weight.  - I educated them on making healthy dietary choices including eating more fruits and vegetables and less fried foods. - I encouraged the patient to exercise more, and educated on the benefits of exercise including weight loss, diabetes prevention, and hypertension prevention.   Dietary counseling with a registered dietician  Referral to a weight management support group (e.g. Weight Watchers, Overeaters Anonymous)  If your BMI is greater than 29 or you have gained more than 15 pounds you should work on weight loss.  Attend a healthy cooking class       Cervical (neck) region somatic dysfunction    No complaints today      Reactive depression    Patient is feeling much better      Other Visit Diagnoses     Primary hypertension    -  Primary       No orders of the defined types were placed in this encounter.   Follow-up: No follow-ups on file.    Corky Downs, MD

## 2021-10-18 NOTE — Assessment & Plan Note (Signed)

## 2021-11-10 ENCOUNTER — Other Ambulatory Visit: Payer: Self-pay

## 2021-11-10 MED ORDER — ZOLPIDEM TARTRATE 10 MG PO TABS: 10.0000 mg | ORAL_TABLET | Freq: Every day | ORAL | 2 refills | Status: DC

## 2021-11-30 ENCOUNTER — Encounter: Payer: Self-pay | Admitting: Internal Medicine

## 2021-11-30 ENCOUNTER — Other Ambulatory Visit: Payer: Self-pay

## 2021-11-30 ENCOUNTER — Ambulatory Visit (INDEPENDENT_AMBULATORY_CARE_PROVIDER_SITE_OTHER): Payer: Federal, State, Local not specified - PPO | Admitting: Internal Medicine

## 2021-11-30 VITALS — BP 135/80 | HR 92 | Ht 65.0 in | Wt 193.5 lb

## 2021-11-30 DIAGNOSIS — I10 Essential (primary) hypertension: Secondary | ICD-10-CM

## 2021-11-30 DIAGNOSIS — L409 Psoriasis, unspecified: Secondary | ICD-10-CM | POA: Diagnosis not present

## 2021-11-30 DIAGNOSIS — E669 Obesity, unspecified: Secondary | ICD-10-CM

## 2021-11-30 DIAGNOSIS — J302 Other seasonal allergic rhinitis: Secondary | ICD-10-CM | POA: Diagnosis not present

## 2021-11-30 DIAGNOSIS — M47812 Spondylosis without myelopathy or radiculopathy, cervical region: Secondary | ICD-10-CM | POA: Diagnosis not present

## 2021-11-30 MED ORDER — OZEMPIC (0.25 OR 0.5 MG/DOSE) 2 MG/1.5ML ~~LOC~~ SOPN: 0.2500 mg | PEN_INJECTOR | SUBCUTANEOUS | 0 refills | Status: DC

## 2021-11-30 NOTE — Assessment & Plan Note (Signed)
We will start the patient on Ozempic 0.25 mg subcutaneously once a week patient has borderline diabetes with a blood sugar of 135.  Going to repeat the blood test in a month

## 2021-11-30 NOTE — Assessment & Plan Note (Signed)
Take Claritin 5 mg p.o. daily 

## 2021-11-30 NOTE — Assessment & Plan Note (Signed)
Stable at the present time. 

## 2021-11-30 NOTE — Assessment & Plan Note (Signed)

## 2021-11-30 NOTE — Progress Notes (Signed)
Established Patient Office Visit  Subjective:  Patient ID: Victoria Mason, female    DOB: [birth year]  Age: 40 y.o. MRN: [id number]  CC:  Chief Complaint  Patient presents with   discuss weight loss    Patient would like to discuss starting med to help with weight loss     HPI  Victoria Mason presents for hbp, interested in weight loss medication Past Medical History:  Diagnosis Date   Dysmenorrhea    History of hypertension    due to phentermine   Seasonal allergies     Past Surgical History:  Procedure Laterality Date   CESAREAN SECTION  02/05/2005   CESAREAN SECTION  01/04/2008   CHOLECYSTECTOMY     DENTAL SURGERY     Dental implant    LAPAROSCOPY     TONSILLECTOMY     TUBAL LIGATION  2009    Family History  Problem Relation Age of Onset   Cancer Neg Hx    Diabetes Neg Hx    Hyperlipidemia Neg Hx    Hypertension Neg Hx    Stroke Neg Hx    Breast cancer Neg Hx     Social History   Socioeconomic History   Marital status: Married    Spouse name: Not on file   Number of children: 3   Years of education: Not on file   Highest education level: Not on file  Occupational History   Not on file  Tobacco Use   Smoking status: Former   Smokeless tobacco: Never  Vaping Use   Vaping Use: Never used  Substance and Sexual Activity   Alcohol use: Yes    Comment: light   Drug use: No   Sexual activity: Yes    Birth control/protection: Surgical    Comment: Tubal ligation   Other Topics Concern   Not on file  Social History Narrative   Not on file   Social Determinants of Health   Financial Resource Strain: Not on file  Food Insecurity: Not on file  Transportation Needs: Not on file  Physical Activity: Not on file  Stress: Not on file  Social Connections: Not on file  Intimate Partner Violence: Not on file     Current Outpatient Medications:    Semaglutide,0.25 or 0.5MG /DOS, (OZEMPIC, 0.25 OR 0.5 MG/DOSE,) 2 MG/1.5ML SOPN, Inject 0.25 mg into the skin  once a week., Disp: 1.5 mL, Rfl: 0   buPROPion (WELLBUTRIN SR) 150 MG 12 hr tablet, TAKE 1 TABLET BY MOUTH TWICE A DAY, Disp: 180 tablet, Rfl: 2   clobetasol cream (TEMOVATE) 0.05 %, Apply 1 application topically 2 (two) times daily., Disp: 45 g, Rfl: 4   cyclobenzaprine (FLEXERIL) 10 MG tablet, TAKE 1 TABLET BY MOUTH EVERYDAY AT BEDTIME, Disp: 30 tablet, Rfl: 0   EPINEPHrine 0.3 mg/0.3 mL IJ SOAJ injection, Inject 0.3 mg into the muscle as needed., Disp: 1 each, Rfl: 3   hydrochlorothiazide (HYDRODIURIL) 25 MG tablet, Take 1 tablet (25 mg total) by mouth daily., Disp: 90 tablet, Rfl: 3   losartan (COZAAR) 50 MG tablet, Take 2 tablets (100 mg total) by mouth daily., Disp: 180 tablet, Rfl: 3   zolpidem (AMBIEN) 10 MG tablet, Take 1 tablet (10 mg total) by mouth daily., Disp: 30 tablet, Rfl: 2   Allergies  Allergen Reactions   Ace Inhibitors Anaphylaxis   Morphine And Related    Pollen Extract Other (See Comments)    Nasal congestion    ROS Review of Systems  Constitutional: Negative.  HENT: Negative.    Eyes: Negative.   Respiratory: Negative.    Cardiovascular: Negative.   Gastrointestinal: Negative.   Endocrine: Negative.   Genitourinary: Negative.   Musculoskeletal: Negative.   Skin: Negative.   Allergic/Immunologic: Negative.   Neurological: Negative.   Hematological: Negative.   Psychiatric/Behavioral: Negative.    All other systems reviewed and are negative.    Objective:    Physical Exam Vitals reviewed.  Constitutional:      Appearance: Normal appearance.  HENT:     Mouth/Throat:     Mouth: Mucous membranes are moist.  Eyes:     Pupils: Pupils are equal, round, and reactive to light.  Neck:     Vascular: No carotid bruit.  Cardiovascular:     Rate and Rhythm: Normal rate and regular rhythm.     Pulses: Normal pulses.     Heart sounds: Normal heart sounds.  Pulmonary:     Effort: Pulmonary effort is normal.     Breath sounds: Normal breath sounds.   Abdominal:     General: Bowel sounds are normal.     Palpations: Abdomen is soft. There is no hepatomegaly, splenomegaly or mass.     Tenderness: There is no abdominal tenderness.     Hernia: No hernia is present.  Musculoskeletal:        General: No tenderness.     Cervical back: Neck supple.     Right lower leg: No edema.     Left lower leg: No edema.  Skin:    Findings: No rash.  Neurological:     Mental Status: She is alert and oriented to person, place, and time.     Motor: No weakness.  Psychiatric:        Mood and Affect: Mood and affect normal.        Behavior: Behavior normal.    BP 135/80   Pulse 92   Ht 5\' 5"  (1.651 m)   Wt 193 lb 8 oz (87.8 kg)   BMI 32.20 kg/m  Wt Readings from Last 3 Encounters:  11/30/21 193 lb 8 oz (87.8 kg)  10/18/21 197 lb 1.6 oz (89.4 kg)  09/20/21 202 lb (91.6 kg)     Health Maintenance Due  Topic Date Due   COVID-19 Vaccine (1) Never done   Pneumococcal Vaccine 67-44 Years old (1 - PCV) Never done   Hepatitis C Screening  Never done   TETANUS/TDAP  Never done   PAP SMEAR-Modifier  05/30/2021    There are no preventive care reminders to display for this patient.  Lab Results  Component Value Date   TSH 0.88 01/29/2021   Lab Results  Component Value Date   WBC 7.3 01/29/2021   HGB 14.0 01/29/2021   HCT 40.8 01/29/2021   MCV 90.5 01/29/2021   PLT 182 01/29/2021   Lab Results  Component Value Date   NA 137 01/29/2021   K 4.1 01/29/2021   CO2 23 01/29/2021   GLUCOSE 135 (H) 01/29/2021   BUN 9 01/29/2021   CREATININE 0.66 01/29/2021   BILITOT 1.2 01/29/2021   AST 20 01/29/2021   ALT 18 01/29/2021   PROT 6.9 01/29/2021   CALCIUM 9.2 01/29/2021   Lab Results  Component Value Date   CHOL 150 01/29/2021   Lab Results  Component Value Date   HDL 41 (L) 01/29/2021   Lab Results  Component Value Date   LDLCALC 88 01/29/2021   Lab Results  Component Value Date   TRIG  115 01/29/2021   Lab Results   Component Value Date   CHOLHDL 3.7 01/29/2021   No results found for: HGBA1C    Assessment & Plan:   Problem List Items Addressed This Visit       Cardiovascular and Mediastinum   Essential hypertension - Primary     Patient denies any chest pain or shortness of breath there is no history of palpitation or paroxysmal nocturnal dyspnea   patient was advised to follow low-salt low-cholesterol diet    ideally I want to keep systolic blood pressure below 810 mmHg, patient was asked to check blood pressure one times a week and give me a report on that.  Patient will be follow-up in 3 months  or earlier as needed, patient will call me back for any change in the cardiovascular symptoms Patient was advised to buy a book from local bookstore concerning blood pressure and read several chapters  every day.  This will be supplemented by some of the material we will give him from the office.  Patient should also utilize other resources like YouTube and Internet to learn more about the blood pressure and the diet.        Musculoskeletal and Integument   Cervical arthritis   Psoriasis and similar disorder    Stable at the present time        Other   Seasonal allergies    Take Claritin 5 mg p.o. daily      Obesity (BMI 30-39.9)    We will start the patient on Ozempic 0.25 mg subcutaneously once a week patient has borderline diabetes with a blood sugar of 135.  Going to repeat the blood test in a month      Relevant Medications   Semaglutide,0.25 or 0.5MG /DOS, (OZEMPIC, 0.25 OR 0.5 MG/DOSE,) 2 MG/1.5ML SOPN    Meds ordered this encounter  Medications   Semaglutide,0.25 or 0.5MG /DOS, (OZEMPIC, 0.25 OR 0.5 MG/DOSE,) 2 MG/1.5ML SOPN    Sig: Inject 0.25 mg into the skin once a week.    Dispense:  1.5 mL    Refill:  0    Follow-up: No follow-ups on file.    Corky Downs, MD

## 2022-01-17 ENCOUNTER — Ambulatory Visit: Payer: Federal, State, Local not specified - PPO | Admitting: Internal Medicine

## 2022-01-17 ENCOUNTER — Other Ambulatory Visit: Payer: Self-pay

## 2022-01-17 ENCOUNTER — Encounter: Payer: Self-pay | Admitting: Internal Medicine

## 2022-01-17 VITALS — BP 134/95 | HR 121 | Ht 65.0 in | Wt 186.0 lb

## 2022-01-17 DIAGNOSIS — E669 Obesity, unspecified: Secondary | ICD-10-CM

## 2022-01-17 DIAGNOSIS — F329 Major depressive disorder, single episode, unspecified: Secondary | ICD-10-CM

## 2022-01-17 DIAGNOSIS — J302 Other seasonal allergic rhinitis: Secondary | ICD-10-CM

## 2022-01-17 DIAGNOSIS — I10 Essential (primary) hypertension: Secondary | ICD-10-CM

## 2022-01-17 DIAGNOSIS — M47812 Spondylosis without myelopathy or radiculopathy, cervical region: Secondary | ICD-10-CM | POA: Diagnosis not present

## 2022-01-17 MED ORDER — ZOLPIDEM TARTRATE 10 MG PO TABS: 10.0000 mg | ORAL_TABLET | Freq: Every day | ORAL | 2 refills | Status: DC

## 2022-01-17 MED ORDER — HYDROCHLOROTHIAZIDE 25 MG PO TABS: 25.0000 mg | ORAL_TABLET | Freq: Every day | ORAL | 3 refills | Status: DC

## 2022-01-17 MED ORDER — OZEMPIC (0.25 OR 0.5 MG/DOSE) 2 MG/1.5ML ~~LOC~~ SOPN: 0.2500 mg | PEN_INJECTOR | SUBCUTANEOUS | 0 refills | Status: DC

## 2022-01-17 NOTE — Addendum Note (Signed)
Addended by: Jobie Quaker on: 01/17/2022 12:06 PM   Modules accepted: Orders

## 2022-01-17 NOTE — Assessment & Plan Note (Signed)

## 2022-01-17 NOTE — Assessment & Plan Note (Signed)
Stable at the present time. 

## 2022-01-17 NOTE — Progress Notes (Signed)
Established Patient Office Visit  Subjective:  Patient ID: Victoria Mason, female    DOB: 22-Dec-1981  Age: 41 y.o. MRN: [id number]  CC:  Chief Complaint  Patient presents with   Follow-up    HPI  Victoria Mason presents for general check  Past Medical History:  Diagnosis Date   Dysmenorrhea    History of hypertension    due to phentermine   Seasonal allergies     Past Surgical History:  Procedure Laterality Date   CESAREAN SECTION  02/05/2005   CESAREAN SECTION  01/04/2008   CHOLECYSTECTOMY     DENTAL SURGERY     Dental implant    LAPAROSCOPY     TONSILLECTOMY     TUBAL LIGATION  2009    Family History  Problem Relation Age of Onset   Cancer Neg Hx    Diabetes Neg Hx    Hyperlipidemia Neg Hx    Hypertension Neg Hx    Stroke Neg Hx    Breast cancer Neg Hx     Social History   Socioeconomic History   Marital status: Married    Spouse name: Not on file   Number of children: 3   Years of education: Not on file   Highest education level: Not on file  Occupational History   Not on file  Tobacco Use   Smoking status: Former   Smokeless tobacco: Never  Vaping Use   Vaping Use: Never used  Substance and Sexual Activity   Alcohol use: Yes    Comment: light   Drug use: No   Sexual activity: Yes    Birth control/protection: Surgical    Comment: Tubal ligation   Other Topics Concern   Not on file  Social History Narrative   Not on file   Social Determinants of Health   Financial Resource Strain: Not on file  Food Insecurity: Not on file  Transportation Needs: Not on file  Physical Activity: Not on file  Stress: Not on file  Social Connections: Not on file  Intimate Partner Violence: Not on file     Current Outpatient Medications:    buPROPion (WELLBUTRIN SR) 150 MG 12 hr tablet, TAKE 1 TABLET BY MOUTH TWICE A DAY, Disp: 180 tablet, Rfl: 2   clobetasol cream (TEMOVATE) 0.05 %, Apply 1 application topically 2 (two) times daily., Disp: 45 g, Rfl:  4   cyclobenzaprine (FLEXERIL) 10 MG tablet, TAKE 1 TABLET BY MOUTH EVERYDAY AT BEDTIME, Disp: 30 tablet, Rfl: 0   EPINEPHrine 0.3 mg/0.3 mL IJ SOAJ injection, Inject 0.3 mg into the muscle as needed., Disp: 1 each, Rfl: 3   hydrochlorothiazide (HYDRODIURIL) 25 MG tablet, Take 1 tablet (25 mg total) by mouth daily., Disp: 90 tablet, Rfl: 3   losartan (COZAAR) 50 MG tablet, Take 2 tablets (100 mg total) by mouth daily., Disp: 180 tablet, Rfl: 3   Semaglutide,0.25 or 0.5MG /DOS, (OZEMPIC, 0.25 OR 0.5 MG/DOSE,) 2 MG/1.5ML SOPN, Inject 0.25 mg into the skin once a week., Disp: 1.5 mL, Rfl: 0   zolpidem (AMBIEN) 10 MG tablet, Take 1 tablet (10 mg total) by mouth daily., Disp: 30 tablet, Rfl: 2   Allergies  Allergen Reactions   Ace Inhibitors Anaphylaxis   Morphine And Related    Pollen Extract Other (See Comments)    Nasal congestion    ROS Review of Systems  Constitutional: Negative.   HENT: Negative.    Eyes: Negative.   Respiratory: Negative.    Cardiovascular: Negative.   Gastrointestinal:  Negative.   Endocrine: Negative.   Genitourinary: Negative.   Musculoskeletal: Negative.   Skin: Negative.   Allergic/Immunologic: Negative.   Neurological: Negative.   Hematological: Negative.   Psychiatric/Behavioral: Negative.    All other systems reviewed and are negative.    Objective:    Physical Exam Vitals reviewed.  Constitutional:      Appearance: Normal appearance.  HENT:     Mouth/Throat:     Mouth: Mucous membranes are moist.  Eyes:     Pupils: Pupils are equal, round, and reactive to light.  Neck:     Vascular: No carotid bruit.  Cardiovascular:     Rate and Rhythm: Normal rate and regular rhythm.     Pulses: Normal pulses.     Heart sounds: Normal heart sounds.  Pulmonary:     Effort: Pulmonary effort is normal.     Breath sounds: Normal breath sounds.  Abdominal:     General: Bowel sounds are normal.     Palpations: Abdomen is soft. There is no hepatomegaly,  splenomegaly or mass.     Tenderness: There is no abdominal tenderness.     Hernia: No hernia is present.  Musculoskeletal:        General: No tenderness.     Cervical back: Neck supple.     Right lower leg: No edema.     Left lower leg: No edema.  Skin:    Findings: No rash.  Neurological:     Mental Status: She is alert and oriented to person, place, and time.     Motor: No weakness.  Psychiatric:        Mood and Affect: Mood and affect normal.        Behavior: Behavior normal.    BP (!) 134/95    Pulse (!) 121    Ht 5\' 5"  (1.651 m)    Wt 186 lb (84.4 kg)    BMI 30.95 kg/m  Wt Readings from Last 3 Encounters:  01/17/22 186 lb (84.4 kg)  11/30/21 193 lb 8 oz (87.8 kg)  10/18/21 197 lb 1.6 oz (89.4 kg)     Health Maintenance Due  Topic Date Due   COVID-19 Vaccine (1) Never done   Hepatitis C Screening  Never done   TETANUS/TDAP  Never done   PAP SMEAR-Modifier  05/30/2021    There are no preventive care reminders to display for this patient.  Lab Results  Component Value Date   TSH 0.88 01/29/2021   Lab Results  Component Value Date   WBC 7.3 01/29/2021   HGB 14.0 01/29/2021   HCT 40.8 01/29/2021   MCV 90.5 01/29/2021   PLT 182 01/29/2021   Lab Results  Component Value Date   NA 137 01/29/2021   K 4.1 01/29/2021   CO2 23 01/29/2021   GLUCOSE 135 (H) 01/29/2021   BUN 9 01/29/2021   CREATININE 0.66 01/29/2021   BILITOT 1.2 01/29/2021   AST 20 01/29/2021   ALT 18 01/29/2021   PROT 6.9 01/29/2021   CALCIUM 9.2 01/29/2021   Lab Results  Component Value Date   CHOL 150 01/29/2021   Lab Results  Component Value Date   HDL 41 (L) 01/29/2021   Lab Results  Component Value Date   LDLCALC 88 01/29/2021   Lab Results  Component Value Date   TRIG 115 01/29/2021   Lab Results  Component Value Date   CHOLHDL 3.7 01/29/2021   No results found for: HGBA1C    Assessment &  Plan:   Problem List Items Addressed This Visit       Cardiovascular and  Mediastinum   Essential hypertension - Primary     Patient denies any chest pain or shortness of breath there is no history of palpitation or paroxysmal nocturnal dyspnea   patient was advised to follow low-salt low-cholesterol diet    ideally I want to keep systolic blood pressure below 725 mmHg, patient was asked to check blood pressure one times a week and give me a report on that.  Patient will be follow-up in 3 months  or earlier as needed, patient will call me back for any change in the cardiovascular symptoms Patient was advised to buy a book from local bookstore concerning blood pressure and read several chapters  every day.  This will be supplemented by some of the material we will give him from the office.  Patient should also utilize other resources like YouTube and Internet to learn more about the blood pressure and the diet.        Musculoskeletal and Integument   Cervical arthritis    Stable at the present time        Other   Seasonal allergies    Take Claritin 5 mg daily as needed      Obesity (BMI 30-39.9)    - I encouraged the patient to lose weight.  - I educated them on making healthy dietary choices including eating more fruits and vegetables and less fried foods. - I encouraged the patient to exercise more, and educated on the benefits of exercise including weight loss, diabetes prevention, and hypertension prevention.   Dietary counseling with a registered dietician  Referral to a weight management support group (e.g. Weight Watchers, Overeaters Anonymous)  If your BMI is greater than 29 or you have gained more than 15 pounds you should work on weight loss.  Attend a healthy cooking class       Reactive depression    Continue to use Wellbutrin, she is better       No orders of the defined types were placed in this encounter.   Follow-up: No follow-ups on file.    Corky Downs, MD

## 2022-01-17 NOTE — Assessment & Plan Note (Addendum)
Continue to use Wellbutrin, she is better

## 2022-01-17 NOTE — Assessment & Plan Note (Signed)
Take Claritin 5 mg daily as needed 

## 2022-01-17 NOTE — Assessment & Plan Note (Signed)

## 2022-02-08 ENCOUNTER — Other Ambulatory Visit: Payer: Self-pay | Admitting: *Deleted

## 2022-02-08 MED ORDER — OZEMPIC (0.25 OR 0.5 MG/DOSE) 2 MG/1.5ML ~~LOC~~ SOPN: 0.2500 mg | PEN_INJECTOR | SUBCUTANEOUS | 6 refills | Status: DC

## 2022-02-16 ENCOUNTER — Other Ambulatory Visit: Payer: Self-pay | Admitting: *Deleted

## 2022-02-16 MED ORDER — OZEMPIC (0.25 OR 0.5 MG/DOSE) 2 MG/1.5ML ~~LOC~~ SOPN: 0.5000 mg | PEN_INJECTOR | SUBCUTANEOUS | 6 refills | Status: DC

## 2022-03-23 DIAGNOSIS — N62 Hypertrophy of breast: Secondary | ICD-10-CM | POA: Diagnosis not present

## 2022-03-23 DIAGNOSIS — Z48817 Encounter for surgical aftercare following surgery on the skin and subcutaneous tissue: Secondary | ICD-10-CM | POA: Diagnosis not present

## 2022-04-18 ENCOUNTER — Encounter: Payer: Self-pay | Admitting: Internal Medicine

## 2022-04-18 ENCOUNTER — Ambulatory Visit: Payer: Federal, State, Local not specified - PPO | Admitting: Internal Medicine

## 2022-04-18 VITALS — BP 127/90 | HR 96 | Ht 65.0 in | Wt 179.6 lb

## 2022-04-18 DIAGNOSIS — I10 Essential (primary) hypertension: Secondary | ICD-10-CM | POA: Diagnosis not present

## 2022-04-18 DIAGNOSIS — L409 Psoriasis, unspecified: Secondary | ICD-10-CM

## 2022-04-18 DIAGNOSIS — F5101 Primary insomnia: Secondary | ICD-10-CM | POA: Diagnosis not present

## 2022-04-18 DIAGNOSIS — M47812 Spondylosis without myelopathy or radiculopathy, cervical region: Secondary | ICD-10-CM

## 2022-04-18 DIAGNOSIS — F329 Major depressive disorder, single episode, unspecified: Secondary | ICD-10-CM

## 2022-04-18 MED ORDER — OZEMPIC (1 MG/DOSE) 2 MG/1.5ML ~~LOC~~ SOPN: 1.0000 mg | PEN_INJECTOR | SUBCUTANEOUS | 3 refills | Status: DC

## 2022-04-18 NOTE — Assessment & Plan Note (Signed)
Patient is tolerating Ambien well ?

## 2022-04-18 NOTE — Assessment & Plan Note (Signed)
?-   Encouraged patient to engage in relaxing activities like yoga, meditation, journaling, going for a walk, or participating in a hobby.  ?- Encouraged patient to reach out to trusted friends or family members about recent struggles, ?Patient was advised to read A book, how to stop worrying and start living, it is good book to read to control  the stress ? ?

## 2022-04-18 NOTE — Assessment & Plan Note (Signed)
Stable at the present time. 

## 2022-04-18 NOTE — Addendum Note (Signed)
Addended by: Jobie Quaker on: 04/18/2022 10:32 AM ? ? Modules accepted: Orders ? ?

## 2022-04-18 NOTE — Assessment & Plan Note (Signed)
It is resolved now 

## 2022-04-18 NOTE — Assessment & Plan Note (Signed)

## 2022-04-18 NOTE — Progress Notes (Signed)
? ?Established Patient Office Visit ? ?Subjective:  ?Patient ID: Jeanann Lewandowskyavonya Matheson, female    DOB: 04/15/1981  Age: 41 y.o. MRN: [id number] ? ?CC:  ?Chief Complaint  ?Patient presents with  ? Follow-up  ?  Patient is here for her routine hypertension check and weight loss check.  ? ? ?HPI ? ?Jeanann Lewandowskyavonya Thinnes presents for check up ? ?Past Medical History:  ?Diagnosis Date  ? Dysmenorrhea   ? History of hypertension   ? due to phentermine  ? Seasonal allergies   ? ? ?Past Surgical History:  ?Procedure Laterality Date  ? CESAREAN SECTION  02/05/2005  ? CESAREAN SECTION  01/04/2008  ? CHOLECYSTECTOMY    ? DENTAL SURGERY    ? Dental implant   ? LAPAROSCOPY    ? TONSILLECTOMY    ? TUBAL LIGATION  2009  ? ? ?Family History  ?Problem Relation Age of Onset  ? Cancer Neg Hx   ? Diabetes Neg Hx   ? Hyperlipidemia Neg Hx   ? Hypertension Neg Hx   ? Stroke Neg Hx   ? Breast cancer Neg Hx   ? ? ?Social History  ? ?Socioeconomic History  ? Marital status: Married  ?  Spouse name: Not on file  ? Number of children: 3  ? Years of education: Not on file  ? Highest education level: Not on file  ?Occupational History  ? Not on file  ?Tobacco Use  ? Smoking status: Former  ? Smokeless tobacco: Never  ?Vaping Use  ? Vaping Use: Never used  ?Substance and Sexual Activity  ? Alcohol use: Yes  ?  Comment: light  ? Drug use: No  ? Sexual activity: Yes  ?  Birth control/protection: Surgical  ?  Comment: Tubal ligation   ?Other Topics Concern  ? Not on file  ?Social History Narrative  ? Not on file  ? ?Social Determinants of Health  ? ?Financial Resource Strain: Not on file  ?Food Insecurity: Not on file  ?Transportation Needs: Not on file  ?Physical Activity: Not on file  ?Stress: Not on file  ?Social Connections: Not on file  ?Intimate Partner Violence: Not on file  ? ? ? ?Current Outpatient Medications:  ?  buPROPion (WELLBUTRIN SR) 150 MG 12 hr tablet, TAKE 1 TABLET BY MOUTH TWICE A DAY, Disp: 180 tablet, Rfl: 2 ?  clobetasol cream (TEMOVATE) 0.05  %, Apply 1 application topically 2 (two) times daily., Disp: 45 g, Rfl: 4 ?  EPINEPHrine 0.3 mg/0.3 mL IJ SOAJ injection, Inject 0.3 mg into the muscle as needed., Disp: 1 each, Rfl: 3 ?  hydrochlorothiazide (HYDRODIURIL) 25 MG tablet, Take 1 tablet (25 mg total) by mouth daily., Disp: 90 tablet, Rfl: 3 ?  losartan (COZAAR) 50 MG tablet, Take 2 tablets (100 mg total) by mouth daily., Disp: 180 tablet, Rfl: 3 ?  Semaglutide,0.25 or 0.5MG /DOS, (OZEMPIC, 0.25 OR 0.5 MG/DOSE,) 2 MG/1.5ML SOPN, Inject 0.5 mg into the skin once a week., Disp: 1.5 mL, Rfl: 6 ?  zolpidem (AMBIEN) 10 MG tablet, Take 1 tablet (10 mg total) by mouth daily., Disp: 30 tablet, Rfl: 2  ? ?Allergies  ?Allergen Reactions  ? Ace Inhibitors Anaphylaxis  ? Morphine And Related   ? Pollen Extract Other (See Comments)  ?  Nasal congestion  ? ? ?ROS ?Review of Systems  ?Constitutional: Negative.   ?HENT: Negative.    ?Eyes: Negative.   ?Respiratory: Negative.    ?Cardiovascular: Negative.   ?Gastrointestinal: Negative.   ?Endocrine: Negative.   ?  Genitourinary: Negative.   ?Musculoskeletal: Negative.   ?Skin: Negative.   ?Allergic/Immunologic: Negative.   ?Neurological: Negative.   ?Hematological: Negative.   ?Psychiatric/Behavioral: Negative.    ?All other systems reviewed and are negative. ? ?  ?Objective:  ?  ?Physical Exam ?Vitals reviewed.  ?Constitutional:   ?   Appearance: Normal appearance.  ?HENT:  ?   Mouth/Throat:  ?   Mouth: Mucous membranes are moist.  ?Eyes:  ?   Pupils: Pupils are equal, round, and reactive to light.  ?Neck:  ?   Vascular: No carotid bruit.  ?Cardiovascular:  ?   Rate and Rhythm: Normal rate and regular rhythm.  ?   Pulses: Normal pulses.  ?   Heart sounds: Normal heart sounds.  ?Pulmonary:  ?   Effort: Pulmonary effort is normal.  ?   Breath sounds: Normal breath sounds.  ?Abdominal:  ?   General: Bowel sounds are normal.  ?   Palpations: Abdomen is soft. There is no hepatomegaly, splenomegaly or mass.  ?   Tenderness:  There is no abdominal tenderness.  ?   Hernia: No hernia is present.  ?Musculoskeletal:     ?   General: No tenderness.  ?   Cervical back: Neck supple.  ?   Right lower leg: No edema.  ?   Left lower leg: No edema.  ?Skin: ?   Findings: No rash.  ?Neurological:  ?   Mental Status: She is alert and oriented to person, place, and time.  ?   Motor: No weakness.  ?Psychiatric:     ?   Mood and Affect: Mood and affect normal.     ?   Behavior: Behavior normal.  ? ? ?BP 127/90   Pulse 96   Ht 5\' 5"  (1.651 m)   Wt 179 lb 9.6 oz (81.5 kg)   BMI 29.89 kg/m?  ?Wt Readings from Last 3 Encounters:  ?04/18/22 179 lb 9.6 oz (81.5 kg)  ?01/17/22 186 lb (84.4 kg)  ?11/30/21 193 lb 8 oz (87.8 kg)  ? ? ? ?Health Maintenance Due  ?Topic Date Due  ? COVID-19 Vaccine (1) Never done  ? Hepatitis C Screening  Never done  ? TETANUS/TDAP  Never done  ? PAP SMEAR-Modifier  05/30/2021  ? ? ?There are no preventive care reminders to display for this patient. ? ?Lab Results  ?Component Value Date  ? TSH 0.88 01/29/2021  ? ?Lab Results  ?Component Value Date  ? WBC 7.3 01/29/2021  ? HGB 14.0 01/29/2021  ? HCT 40.8 01/29/2021  ? MCV 90.5 01/29/2021  ? PLT 182 01/29/2021  ? ?Lab Results  ?Component Value Date  ? NA 137 01/29/2021  ? K 4.1 01/29/2021  ? CO2 23 01/29/2021  ? GLUCOSE 135 (H) 01/29/2021  ? BUN 9 01/29/2021  ? CREATININE 0.66 01/29/2021  ? BILITOT 1.2 01/29/2021  ? AST 20 01/29/2021  ? ALT 18 01/29/2021  ? PROT 6.9 01/29/2021  ? CALCIUM 9.2 01/29/2021  ? ?Lab Results  ?Component Value Date  ? CHOL 150 01/29/2021  ? ?Lab Results  ?Component Value Date  ? HDL 41 (L) 01/29/2021  ? ?Lab Results  ?Component Value Date  ? LDLCALC 88 01/29/2021  ? ?Lab Results  ?Component Value Date  ? TRIG 115 01/29/2021  ? ?Lab Results  ?Component Value Date  ? CHOLHDL 3.7 01/29/2021  ? ?No results found for: HGBA1C ? ?  ?Assessment & Plan:  ? ?Problem List Items Addressed This Visit   ? ?  ?  Cardiovascular and Mediastinum  ? Essential hypertension -  Primary  ?   Patient denies any chest pain or shortness of breath there is no history of palpitation or paroxysmal nocturnal dyspnea ?  patient was advised to follow low-salt low-cholesterol diet ? ?  ideally I want to keep systolic blood pressure below 185 mmHg, patient was asked to check blood pressure one times a week and give me a report on that.  Patient will be follow-up in 3 months  or earlier as needed, patient will call me back for any change in the cardiovascular symptoms ?Patient was advised to buy a book from local bookstore concerning blood pressure and read several chapters  every day.  This will be supplemented by some of the material we will give him from the office.  Patient should also utilize other resources like YouTube and Internet to learn more about the blood pressure and the diet. ? ?  ?  ?  ? Musculoskeletal and Integument  ? Cervical arthritis  ?  Stable at the present time ? ?  ?  ? Psoriasis and similar disorder  ?  It is resolved now ? ?  ?  ?  ? Other  ? Primary insomnia  ?  Patient is tolerating Ambien well ? ?  ?  ? Reactive depression  ?   ?- Encouraged patient to engage in relaxing activities like yoga, meditation, journaling, going for a walk, or participating in a hobby.  ?- Encouraged patient to reach out to trusted friends or family members about recent struggles, ?Patient was advised to read A book, how to stop worrying and start living, it is good book to read to control  the stress ? ? ?  ?  ? ? ?No orders of the defined types were placed in this encounter. ? ? ?Follow-up: No follow-ups on file.  ? ? ?Corky Downs, MD ?

## 2022-05-06 ENCOUNTER — Other Ambulatory Visit: Payer: Self-pay

## 2022-05-06 MED ORDER — ZOLPIDEM TARTRATE 10 MG PO TABS: 10.0000 mg | ORAL_TABLET | Freq: Every day | ORAL | 2 refills | Status: DC

## 2022-05-12 ENCOUNTER — Ambulatory Visit (INDEPENDENT_AMBULATORY_CARE_PROVIDER_SITE_OTHER): Payer: Federal, State, Local not specified - PPO | Admitting: Nurse Practitioner

## 2022-05-12 ENCOUNTER — Encounter: Payer: Self-pay | Admitting: Nurse Practitioner

## 2022-05-12 VITALS — BP 133/95 | HR 90 | Ht 65.0 in | Wt 173.9 lb

## 2022-05-12 DIAGNOSIS — M25561 Pain in right knee: Secondary | ICD-10-CM | POA: Diagnosis not present

## 2022-05-12 MED ORDER — DICLOFENAC SODIUM 1 % EX GEL: 2.0000 g | Freq: Four times a day (QID) | CUTANEOUS | 1 refills | Status: DC

## 2022-05-12 NOTE — Progress Notes (Signed)
Established Patient Office Visit  Subjective:  Patient ID: Victoria Mason, female    DOB: [birth year]  Age: 41 y.o. MRN: [id number]  CC:  Chief Complaint  Patient presents with   Knee Pain    Patient complains of right knee pain, difficulty walking, pain started 1 week ago.     HPI  Victoria Mason presents for knee pain off   Knee Pain  The incident occurred more than 1 week ago. There was no injury mechanism. The pain is present in the right knee. The quality of the pain is described as aching. The pain is at a severity of 4/10. The pain is moderate. The pain has been Intermittent since onset. Associated symptoms include an inability to bear weight. Pertinent negatives include no loss of sensation or numbness. She reports no foreign bodies present. The symptoms are aggravated by weight bearing (and cold weather). She has tried rest for the symptoms. The treatment provided mild relief.    Past Medical History:  Diagnosis Date   Dysmenorrhea    History of hypertension    due to phentermine   Seasonal allergies     Past Surgical History:  Procedure Laterality Date   CESAREAN SECTION  02/05/2005   CESAREAN SECTION  01/04/2008   CHOLECYSTECTOMY     DENTAL SURGERY     Dental implant    LAPAROSCOPY     TONSILLECTOMY     TUBAL LIGATION  2009    Family History  Problem Relation Age of Onset   Cancer Neg Hx    Diabetes Neg Hx    Hyperlipidemia Neg Hx    Hypertension Neg Hx    Stroke Neg Hx    Breast cancer Neg Hx     Social History   Socioeconomic History   Marital status: Married    Spouse name: Not on file   Number of children: 3   Years of education: Not on file   Highest education level: Not on file  Occupational History   Not on file  Tobacco Use   Smoking status: Former   Smokeless tobacco: Never  Vaping Use   Vaping Use: Never used  Substance and Sexual Activity   Alcohol use: Yes    Comment: light   Drug use: No   Sexual activity: Yes    Birth  control/protection: Surgical    Comment: Tubal ligation   Other Topics Concern   Not on file  Social History Narrative   Not on file   Social Determinants of Health   Financial Resource Strain: Not on file  Food Insecurity: Not on file  Transportation Needs: Not on file  Physical Activity: Not on file  Stress: Not on file  Social Connections: Not on file  Intimate Partner Violence: Not on file     Outpatient Medications Prior to Visit  Medication Sig Dispense Refill   buPROPion (WELLBUTRIN SR) 150 MG 12 hr tablet TAKE 1 TABLET BY MOUTH TWICE A DAY 180 tablet 2   clobetasol cream (TEMOVATE) [id number] % Apply 1 application topically 2 (two) times daily. 45 g 4   EPINEPHrine 0.3 mg/0.3 mL IJ SOAJ injection Inject 0.3 mg into the muscle as needed. 1 each 3   hydrochlorothiazide (HYDRODIURIL) 25 MG tablet Take 1 tablet (25 mg total) by mouth daily. 90 tablet 3   losartan (COZAAR) 50 MG tablet Take 2 tablets (100 mg total) by mouth daily. 180 tablet 3   Semaglutide, 1 MG/DOSE, (OZEMPIC, 1 MG/DOSE,) 2 MG/1.5ML SOPN Inject  1 mg into the skin once a week. 1.5 mL 3   zolpidem (AMBIEN) 10 MG tablet Take 1 tablet (10 mg total) by mouth daily. 30 tablet 2   No facility-administered medications prior to visit.    Allergies  Allergen Reactions   Ace Inhibitors Anaphylaxis   Morphine And Related    Pollen Extract Other (See Comments)    Nasal congestion    ROS Review of Systems  Constitutional:  Negative for activity change, chills and fatigue.  HENT:  Negative for congestion, ear pain, nosebleeds and sore throat.   Eyes:  Negative for discharge and redness.  Respiratory:  Negative for cough, chest tightness and shortness of breath.   Cardiovascular:  Negative for chest pain and palpitations.  Gastrointestinal:  Negative for abdominal distention and constipation.  Genitourinary:  Negative for difficulty urinating, frequency and urgency.  Musculoskeletal:  Positive for arthralgias (right  knee pain).  Skin:  Negative for color change, pallor and rash.  Neurological:  Negative for dizziness, facial asymmetry and numbness.  Psychiatric/Behavioral:  Negative for agitation, behavioral problems and confusion.      Objective:    Physical Exam Constitutional:      Appearance: She is overweight.  HENT:     Head: Normocephalic.     Right Ear: Tympanic membrane normal.     Left Ear: Tympanic membrane normal.     Nose: Nose normal.     Mouth/Throat:     Mouth: Mucous membranes are moist.     Pharynx: Oropharynx is clear.  Eyes:     Extraocular Movements: Extraocular movements intact.     Conjunctiva/sclera: Conjunctivae normal.     Pupils: Pupils are equal, round, and reactive to light.  Cardiovascular:     Rate and Rhythm: Normal rate and regular rhythm.     Pulses: Normal pulses.     Heart sounds: Normal heart sounds.  Pulmonary:     Effort: Pulmonary effort is normal.  Abdominal:     General: Bowel sounds are normal.     Palpations: Abdomen is soft.  Musculoskeletal:     Cervical back: Normal range of motion.     Right knee: Tenderness present over the medial joint line.  Neurological:     Mental Status: She is alert.    BP (!) 133/95   Pulse 90   Ht 5\' 5"  (1.651 m)   Wt 173 lb 14.4 oz (78.9 kg)   BMI 28.94 kg/m  Wt Readings from Last 3 Encounters:  05/12/22 173 lb 14.4 oz (78.9 kg)  04/18/22 179 lb 9.6 oz (81.5 kg)  01/17/22 186 lb (84.4 kg)     Health Maintenance Due  Topic Date Due   COVID-19 Vaccine (1) Never done   Hepatitis C Screening  Never done   TETANUS/TDAP  Never done   PAP SMEAR-Modifier  05/30/2021    There are no preventive care reminders to display for this patient.  Lab Results  Component Value Date   TSH 0.88 01/29/2021   Lab Results  Component Value Date   WBC 7.3 01/29/2021   HGB 14.0 01/29/2021   HCT 40.8 01/29/2021   MCV 90.5 01/29/2021   PLT 182 01/29/2021   Lab Results  Component Value Date   NA 137 01/29/2021    K 4.1 01/29/2021   CO2 23 01/29/2021   GLUCOSE 135 (H) 01/29/2021   BUN 9 01/29/2021   CREATININE 0.66 01/29/2021   BILITOT 1.2 01/29/2021   AST 20 01/29/2021   ALT  18 01/29/2021   PROT 6.9 01/29/2021   CALCIUM 9.2 01/29/2021   Lab Results  Component Value Date   CHOL 150 01/29/2021   Lab Results  Component Value Date   HDL 41 (L) 01/29/2021   Lab Results  Component Value Date   LDLCALC 88 01/29/2021   Lab Results  Component Value Date   TRIG 115 01/29/2021   Lab Results  Component Value Date   CHOLHDL 3.7 01/29/2021   No results found for: HGBA1C    Assessment & Plan:   Problem List Items Addressed This Visit       Other   Acute pain of right knee - Primary    Patient not want to take any pain medication po. Advised to apply diclofenac gel and use knee wrap compression bandage. Apply hot pack and rest. Will send for ortho consult if symptoms does not improves in a week.          Meds ordered this encounter  Medications   diclofenac Sodium (VOLTAREN) 1 % GEL    Sig: Apply 2 g topically 4 (four) times daily.    Dispense:  50 g    Refill:  1     Follow-up: No follow-ups on file.    Theresia Lo, NP

## 2022-05-13 ENCOUNTER — Encounter: Payer: Self-pay | Admitting: Nurse Practitioner

## 2022-05-13 DIAGNOSIS — M25561 Pain in right knee: Secondary | ICD-10-CM | POA: Insufficient documentation

## 2022-05-13 NOTE — Assessment & Plan Note (Signed)
Patient not want to take any pain medication po. Advised to apply diclofenac gel and use knee wrap compression bandage. Apply hot pack and rest. Will send for ortho consult if symptoms does not improves in a week.

## 2022-05-16 NOTE — Progress Notes (Unsigned)
No chief complaint on file.    HPI:      [name] is a 41 y.o. [phone] who LMP was No LMP recorded., presents today for her annual examination. Her menses are regular every 28-30 days, lasting 4-5 days. Has 1 heavy day, changing product Q1-2 hrs. Dysmenorrhea moderate, occurring first 1-2 days of flow. Takes motrin with mild improvement. She does not have intermenstrual bleeding. She is s/p endometrial ablation due to menorrhagia. Bleeding is improved but cramping is still bad. She took 3 mo OCP 2016 with sx relief and would like to restart. Had HTN last yr but found it was side effect of phentermine use. Pt stopped phentermine and BP returned to normal. Pt no longer taking BP meds. BP slightly elevated today because she didn't know she was getting annual/pap.  Checks BP on apple watch and it's normal.   Sex activity: single partner, contraception - tubal ligation.  Last Pap: 05/30/18  Results were: no abnormalities /neg HPV DNA  Hx of STDs: none  Last mammo: 05/30/17  Results were normal  There is no FH of breast cancer. There is no FH of ovarian cancer. The patient does do self-breast exams.   Tobacco use: The patient denies current or previous tobacco use. Alcohol use: none Exercise: not active   She does get adequate calcium but not Vitamin D in her diet.   Labs with PCP.    Past Medical History:  Diagnosis Date   Dysmenorrhea    History of hypertension    due to phentermine   Seasonal allergies     Past Surgical History:  Procedure Laterality Date   CESAREAN SECTION  02/05/2005   CESAREAN SECTION  01/04/2008   CHOLECYSTECTOMY     DENTAL SURGERY     Dental implant    LAPAROSCOPY     TONSILLECTOMY     TUBAL LIGATION  2009    Family History  Problem Relation Age of Onset   Cancer Neg Hx    Diabetes Neg Hx    Hyperlipidemia Neg Hx    Hypertension Neg Hx    Stroke Neg Hx    Breast cancer Neg Hx     Social History   Socioeconomic History   Marital  status: Married    Spouse name: Not on file   Number of children: 3   Years of education: Not on file   Highest education level: Not on file  Occupational History   Not on file  Tobacco Use   Smoking status: Former   Smokeless tobacco: Never  Vaping Use   Vaping Use: Never used  Substance and Sexual Activity   Alcohol use: Yes    Comment: light   Drug use: No   Sexual activity: Yes    Birth control/protection: Surgical    Comment: Tubal ligation   Other Topics Concern   Not on file  Social History Narrative   Not on file   Social Determinants of Health   Financial Resource Strain: Not on file  Food Insecurity: Not on file  Transportation Needs: Not on file  Physical Activity: Not on file  Stress: Not on file  Social Connections: Not on file  Intimate Partner Violence: Not on file    Current Outpatient Medications on File Prior to Visit  Medication Sig Dispense Refill   buPROPion (WELLBUTRIN SR) 150 MG 12 hr tablet TAKE 1 TABLET BY MOUTH TWICE A DAY 180 tablet 2   clobetasol cream (TEMOVATE) 0.05 %  Apply 1 application topically 2 (two) times daily. 45 g 4   diclofenac Sodium (VOLTAREN) 1 % GEL Apply 2 g topically 4 (four) times daily. 50 g 1   EPINEPHrine 0.3 mg/0.3 mL IJ SOAJ injection Inject 0.3 mg into the muscle as needed. 1 each 3   hydrochlorothiazide (HYDRODIURIL) 25 MG tablet Take 1 tablet (25 mg total) by mouth daily. 90 tablet 3   losartan (COZAAR) 50 MG tablet Take 2 tablets (100 mg total) by mouth daily. 180 tablet 3   Semaglutide, 1 MG/DOSE, (OZEMPIC, 1 MG/DOSE,) 2 MG/1.5ML SOPN Inject 1 mg into the skin once a week. 1.5 mL 3   zolpidem (AMBIEN) 10 MG tablet Take 1 tablet (10 mg total) by mouth daily. 30 tablet 2   No current facility-administered medications on file prior to visit.      ROS:  Review of Systems  Constitutional:  Negative for fatigue, fever and unexpected weight change.  Respiratory:  Negative for cough, shortness of breath and  wheezing.   Cardiovascular:  Negative for chest pain, palpitations and leg swelling.  Gastrointestinal:  Negative for blood in stool, constipation, diarrhea, nausea and vomiting.  Endocrine: Negative for cold intolerance, heat intolerance and polyuria.  Genitourinary:  Negative for dyspareunia, dysuria, flank pain, frequency, genital sores, hematuria, menstrual problem, pelvic pain, urgency, vaginal bleeding, vaginal discharge and vaginal pain.  Musculoskeletal:  Negative for back pain, joint swelling and myalgias.  Skin:  Negative for rash.  Neurological:  Negative for dizziness, syncope, light-headedness, numbness and headaches.  Hematological:  Negative for adenopathy.  Psychiatric/Behavioral:  Negative for agitation, confusion, sleep disturbance and suicidal ideas. The patient is not nervous/anxious.     Objective: There were no vitals taken for this visit.   Physical Exam Constitutional:      Appearance: She is well-developed.  Genitourinary:     No vaginal discharge, erythema or tenderness.      Right Adnexa: not tender and no mass present.    Left Adnexa: not tender and no mass present.    No cervical motion tenderness or polyp.     Uterus is not enlarged or tender.  Breasts:    Right: No mass, nipple discharge, skin change or tenderness.     Left: No mass, nipple discharge, skin change or tenderness.  Neck:     Thyroid: No thyromegaly.  Cardiovascular:     Rate and Rhythm: Normal rate and regular rhythm.     Heart sounds: Normal heart sounds. No murmur heard. Pulmonary:     Effort: Pulmonary effort is normal.     Breath sounds: Normal breath sounds.  Abdominal:     Palpations: Abdomen is soft.     Tenderness: There is no abdominal tenderness. There is no guarding.  Musculoskeletal:        General: Normal range of motion.     Cervical back: Normal range of motion.  Neurological:     Mental Status: She is alert and oriented to person, place, and time.     Cranial  Nerves: No cranial nerve deficit.  Psychiatric:        Behavior: Behavior normal.  Vitals reviewed.    Assessment/Plan: No diagnosis found.  No orders of the defined types were placed in this encounter.            GYN counsel breast self exam, diet/exercise, calcium and Vit D supp     F/U  No follow-ups on file.  Shyanna Klingel B. Chalsea Darko, PA-C 05/16/2022 10:53 AM

## 2022-05-17 ENCOUNTER — Ambulatory Visit: Payer: 59 | Admitting: Obstetrics and Gynecology

## 2022-05-17 DIAGNOSIS — Z124 Encounter for screening for malignant neoplasm of cervix: Secondary | ICD-10-CM

## 2022-05-17 DIAGNOSIS — Z1231 Encounter for screening mammogram for malignant neoplasm of breast: Secondary | ICD-10-CM

## 2022-05-17 DIAGNOSIS — Z1151 Encounter for screening for human papillomavirus (HPV): Secondary | ICD-10-CM

## 2022-05-17 DIAGNOSIS — Z01419 Encounter for gynecological examination (general) (routine) without abnormal findings: Secondary | ICD-10-CM

## 2022-05-26 ENCOUNTER — Other Ambulatory Visit: Payer: Self-pay | Admitting: *Deleted

## 2022-05-26 MED ORDER — ONDANSETRON HCL 8 MG PO TABS
8.0000 mg | ORAL_TABLET | Freq: Three times a day (TID) | ORAL | 1 refills | Status: DC | PRN
Start: 2022-05-26 — End: 2022-09-19

## 2022-06-13 ENCOUNTER — Ambulatory Visit: Payer: Federal, State, Local not specified - PPO | Admitting: Internal Medicine

## 2022-06-29 ENCOUNTER — Ambulatory Visit: Payer: 59 | Admitting: Obstetrics and Gynecology

## 2022-06-29 NOTE — Progress Notes (Deleted)
No chief complaint on file.    HPI:      [name] is a 41 y.o. [phone] who LMP was No LMP recorded., presents today for her NP> 3 yrs annual examination. Her menses are regular every 28-30 days, lasting 4-5 days. Has 1 heavy day, changing product Q1-2 hrs. Dysmenorrhea moderate, occurring first 1-2 days of flow. Takes motrin with mild improvement. She does not have intermenstrual bleeding. She is s/p endometrial ablation due to menorrhagia. Bleeding is improved but cramping is still bad. She took 3 mo OCP 2016 with sx relief and would like to restart. Had HTN last yr but found it was side effect of phentermine use. Pt stopped phentermine and BP returned to normal. Pt no longer taking BP meds. BP slightly elevated today because she didn't know she was getting annual/pap.  Checks BP on apple watch and it's normal.   Sex activity: single partner, contraception - tubal ligation.  Last Pap: February 06, 2015  Results were: no abnormalities /neg HPV DNA  Hx of STDs: none  Last mammogram: never  There is no FH of breast cancer. There is no FH of ovarian cancer. The patient does do self-breast exams.   Tobacco use: The patient denies current or previous tobacco use. Alcohol use: none No drug use Exercise: not active   She does get adequate calcium but not Vitamin D in her diet.   Labs with PCP.    Past Medical History:  Diagnosis Date   Dysmenorrhea    History of hypertension    due to phentermine   Seasonal allergies     Past Surgical History:  Procedure Laterality Date   CESAREAN SECTION  02/05/2005   CESAREAN SECTION  01/04/2008   CHOLECYSTECTOMY     DENTAL SURGERY     Dental implant    LAPAROSCOPY     TONSILLECTOMY     TUBAL LIGATION  2009    Family History  Problem Relation Age of Onset   Cancer Neg Hx    Diabetes Neg Hx    Hyperlipidemia Neg Hx    Hypertension Neg Hx    Stroke Neg Hx    Breast cancer Neg Hx     Social History   Socioeconomic History    Marital status: Married    Spouse name: Not on file   Number of children: 3   Years of education: Not on file   Highest education level: Not on file  Occupational History   Not on file  Tobacco Use   Smoking status: Former   Smokeless tobacco: Never  Vaping Use   Vaping Use: Never used  Substance and Sexual Activity   Alcohol use: Yes    Comment: light   Drug use: No   Sexual activity: Yes    Birth control/protection: Surgical    Comment: Tubal ligation   Other Topics Concern   Not on file  Social History Narrative   Not on file   Social Determinants of Health   Financial Resource Strain: Not on file  Food Insecurity: Not on file  Transportation Needs: Not on file  Physical Activity: Not on file  Stress: Not on file  Social Connections: Not on file  Intimate Partner Violence: Not on file    Current Outpatient Medications on File Prior to Visit  Medication Sig Dispense Refill   buPROPion (WELLBUTRIN SR) 150 MG 12 hr tablet TAKE 1 TABLET BY MOUTH TWICE A DAY 180 tablet 2   clobetasol  cream (TEMOVATE) 0.05 % Apply 1 application topically 2 (two) times daily. 45 g 4   diclofenac Sodium (VOLTAREN) 1 % GEL Apply 2 g topically 4 (four) times daily. 50 g 1   EPINEPHrine 0.3 mg/0.3 mL IJ SOAJ injection Inject 0.3 mg into the muscle as needed. 1 each 3   hydrochlorothiazide (HYDRODIURIL) 25 MG tablet Take 1 tablet (25 mg total) by mouth daily. 90 tablet 3   losartan (COZAAR) 50 MG tablet Take 2 tablets (100 mg total) by mouth daily. 180 tablet 3   ondansetron (ZOFRAN) 8 MG tablet Take 1 tablet (8 mg total) by mouth every 8 (eight) hours as needed for nausea or vomiting. 30 tablet 1   Semaglutide, 1 MG/DOSE, (OZEMPIC, 1 MG/DOSE,) 2 MG/1.5ML SOPN Inject 1 mg into the skin once a week. 1.5 mL 3   zolpidem (AMBIEN) 10 MG tablet Take 1 tablet (10 mg total) by mouth daily. 30 tablet 2   No current facility-administered medications on file prior to visit.      ROS:  Review of  Systems  Constitutional:  Negative for fatigue, fever and unexpected weight change.  Respiratory:  Negative for cough, shortness of breath and wheezing.   Cardiovascular:  Negative for chest pain, palpitations and leg swelling.  Gastrointestinal:  Negative for blood in stool, constipation, diarrhea, nausea and vomiting.  Endocrine: Negative for cold intolerance, heat intolerance and polyuria.  Genitourinary:  Negative for dyspareunia, dysuria, flank pain, frequency, genital sores, hematuria, menstrual problem, pelvic pain, urgency, vaginal bleeding, vaginal discharge and vaginal pain.  Musculoskeletal:  Negative for back pain, joint swelling and myalgias.  Skin:  Negative for rash.  Neurological:  Negative for dizziness, syncope, light-headedness, numbness and headaches.  Hematological:  Negative for adenopathy.  Psychiatric/Behavioral:  Negative for agitation, confusion, sleep disturbance and suicidal ideas. The patient is not nervous/anxious.      Objective: There were no vitals taken for this visit.   Physical Exam Constitutional:      Appearance: She is well-developed.  Genitourinary:     No vaginal discharge, erythema or tenderness.      Right Adnexa: not tender and no mass present.    Left Adnexa: not tender and no mass present.    No cervical motion tenderness or polyp.     Uterus is not enlarged or tender.  Breasts:    Right: No mass, nipple discharge, skin change or tenderness.     Left: No mass, nipple discharge, skin change or tenderness.  Neck:     Thyroid: No thyromegaly.  Cardiovascular:     Rate and Rhythm: Normal rate and regular rhythm.     Heart sounds: Normal heart sounds. No murmur heard. Pulmonary:     Effort: Pulmonary effort is normal.     Breath sounds: Normal breath sounds.  Abdominal:     Palpations: Abdomen is soft.     Tenderness: There is no abdominal tenderness. There is no guarding.  Musculoskeletal:        General: Normal range of motion.      Cervical back: Normal range of motion.  Neurological:     Mental Status: She is alert and oriented to person, place, and time.     Cranial Nerves: No cranial nerve deficit.  Psychiatric:        Behavior: Behavior normal.  Vitals reviewed.     Assessment/Plan: No diagnosis found.  No orders of the defined types were placed in this encounter.  GYN counsel breast self exam, diet/exercise, calcium and Vit D supp     F/U  No follow-ups on file.  Johnae Friley B. Miu Chiong, PA-C 06/29/2022 10:21 AM

## 2022-07-19 ENCOUNTER — Ambulatory Visit (INDEPENDENT_AMBULATORY_CARE_PROVIDER_SITE_OTHER): Payer: Federal, State, Local not specified - PPO | Admitting: Internal Medicine

## 2022-07-19 ENCOUNTER — Encounter: Payer: Self-pay | Admitting: Internal Medicine

## 2022-07-19 VITALS — BP 134/80 | HR 98 | Ht 64.0 in | Wt 167.1 lb

## 2022-07-19 DIAGNOSIS — I10 Essential (primary) hypertension: Secondary | ICD-10-CM

## 2022-07-19 DIAGNOSIS — L409 Psoriasis, unspecified: Secondary | ICD-10-CM

## 2022-07-19 DIAGNOSIS — M9901 Segmental and somatic dysfunction of cervical region: Secondary | ICD-10-CM

## 2022-07-19 DIAGNOSIS — E669 Obesity, unspecified: Secondary | ICD-10-CM | POA: Diagnosis not present

## 2022-07-19 NOTE — Assessment & Plan Note (Signed)
Stable

## 2022-07-19 NOTE — Assessment & Plan Note (Signed)
Under control 

## 2022-07-19 NOTE — Progress Notes (Signed)
Established Patient Office Visit  Subjective:  Patient ID: Victoria Mason, female    DOB: 03-19-1981  Age: 41 y.o. MRN: [id number]  CC:  Chief Complaint  Patient presents with   Follow-up    Patient is here for 2 month follow up    HPI  Victoria Mason presents for general check  Past Medical History:  Diagnosis Date   Dysmenorrhea    History of hypertension    due to phentermine   Seasonal allergies     Past Surgical History:  Procedure Laterality Date   CESAREAN SECTION  02/05/2005   CESAREAN SECTION  01/04/2008   CHOLECYSTECTOMY     DENTAL SURGERY     Dental implant    LAPAROSCOPY     TONSILLECTOMY     TUBAL LIGATION  2009    Family History  Problem Relation Age of Onset   Cancer Neg Hx    Diabetes Neg Hx    Hyperlipidemia Neg Hx    Hypertension Neg Hx    Stroke Neg Hx    Breast cancer Neg Hx     Social History   Socioeconomic History   Marital status: Married    Spouse name: Not on file   Number of children: 3   Years of education: Not on file   Highest education level: Not on file  Occupational History   Not on file  Tobacco Use   Smoking status: Former   Smokeless tobacco: Never  Vaping Use   Vaping Use: Never used  Substance and Sexual Activity   Alcohol use: Yes    Comment: light   Drug use: No   Sexual activity: Yes    Birth control/protection: Surgical    Comment: Tubal ligation   Other Topics Concern   Not on file  Social History Narrative   Not on file   Social Determinants of Health   Financial Resource Strain: Not on file  Food Insecurity: Not on file  Transportation Needs: Not on file  Physical Activity: Not on file  Stress: Not on file  Social Connections: Not on file  Intimate Partner Violence: Not on file     Current Outpatient Medications:    buPROPion (WELLBUTRIN SR) 150 MG 12 hr tablet, TAKE 1 TABLET BY MOUTH TWICE A DAY, Disp: 180 tablet, Rfl: 2   clobetasol cream (TEMOVATE) 0.05 %, Apply 1 application topically  2 (two) times daily., Disp: 45 g, Rfl: 4   diclofenac Sodium (VOLTAREN) 1 % GEL, Apply 2 g topically 4 (four) times daily., Disp: 50 g, Rfl: 1   EPINEPHrine 0.3 mg/0.3 mL IJ SOAJ injection, Inject 0.3 mg into the muscle as needed., Disp: 1 each, Rfl: 3   hydrochlorothiazide (HYDRODIURIL) 25 MG tablet, Take 1 tablet (25 mg total) by mouth daily., Disp: 90 tablet, Rfl: 3   losartan (COZAAR) 50 MG tablet, Take 2 tablets (100 mg total) by mouth daily., Disp: 180 tablet, Rfl: 3   ondansetron (ZOFRAN) 8 MG tablet, Take 1 tablet (8 mg total) by mouth every 8 (eight) hours as needed for nausea or vomiting., Disp: 30 tablet, Rfl: 1   Semaglutide, 1 MG/DOSE, (OZEMPIC, 1 MG/DOSE,) 2 MG/1.5ML SOPN, Inject 1 mg into the skin once a week., Disp: 1.5 mL, Rfl: 3   zolpidem (AMBIEN) 10 MG tablet, Take 1 tablet (10 mg total) by mouth daily., Disp: 30 tablet, Rfl: 2   Allergies  Allergen Reactions   Ace Inhibitors Anaphylaxis   Morphine And Related    Pollen  Extract Other (See Comments)    Nasal congestion    ROS Review of Systems  Constitutional: Negative.   HENT: Negative.    Eyes: Negative.   Respiratory: Negative.    Cardiovascular: Negative.   Gastrointestinal: Negative.   Endocrine: Negative.   Genitourinary: Negative.   Musculoskeletal: Negative.   Skin: Negative.   Allergic/Immunologic: Negative.   Neurological: Negative.   Hematological: Negative.   Psychiatric/Behavioral: Negative.    All other systems reviewed and are negative.     Objective:    Physical Exam Vitals reviewed.  Constitutional:      Appearance: Normal appearance.  HENT:     Mouth/Throat:     Mouth: Mucous membranes are moist.  Eyes:     Pupils: Pupils are equal, round, and reactive to light.  Neck:     Vascular: No carotid bruit.  Cardiovascular:     Rate and Rhythm: Normal rate and regular rhythm.     Pulses: Normal pulses.     Heart sounds: Normal heart sounds.  Pulmonary:     Effort: Pulmonary effort  is normal.     Breath sounds: Normal breath sounds.  Abdominal:     General: Bowel sounds are normal.     Palpations: Abdomen is soft. There is no hepatomegaly, splenomegaly or mass.     Tenderness: There is no abdominal tenderness.     Hernia: No hernia is present.  Musculoskeletal:        General: No tenderness.     Cervical back: Neck supple.     Right lower leg: No edema.     Left lower leg: No edema.  Skin:    Findings: No rash.  Neurological:     Mental Status: She is alert and oriented to person, place, and time.     Motor: No weakness.  Psychiatric:        Mood and Affect: Mood and affect normal.        Behavior: Behavior normal.     BP 134/80   Pulse 98   Ht 5\' 4"  (1.626 m)   Wt 167 lb 1.6 oz (75.8 kg)   BMI 28.68 kg/m  Wt Readings from Last 3 Encounters:  07/19/22 167 lb 1.6 oz (75.8 kg)  05/12/22 173 lb 14.4 oz (78.9 kg)  04/18/22 179 lb 9.6 oz (81.5 kg)     Health Maintenance Due  Topic Date Due   COVID-19 Vaccine (1) Never done   Hepatitis C Screening  Never done   TETANUS/TDAP  Never done    There are no preventive care reminders to display for this patient.  Lab Results  Component Value Date   TSH 0.88 01/29/2021   Lab Results  Component Value Date   WBC 7.3 01/29/2021   HGB 14.0 01/29/2021   HCT 40.8 01/29/2021   MCV 90.5 01/29/2021   PLT 182 01/29/2021   Lab Results  Component Value Date   NA 137 01/29/2021   K 4.1 01/29/2021   CO2 23 01/29/2021   GLUCOSE 135 (H) 01/29/2021   BUN 9 01/29/2021   CREATININE 0.66 01/29/2021   BILITOT 1.2 01/29/2021   AST 20 01/29/2021   ALT 18 01/29/2021   PROT 6.9 01/29/2021   CALCIUM 9.2 01/29/2021   Lab Results  Component Value Date   CHOL 150 01/29/2021   Lab Results  Component Value Date   HDL 41 (L) 01/29/2021   Lab Results  Component Value Date   LDLCALC 88 01/29/2021   Lab Results  Component Value Date   TRIG 115 01/29/2021   Lab Results  Component Value Date   CHOLHDL 3.7  01/29/2021   No results found for: "HGBA1C"    Assessment & Plan:   Problem List Items Addressed This Visit       Cardiovascular and Mediastinum   Essential hypertension - Primary     Patient denies any chest pain or shortness of breath there is no history of palpitation or paroxysmal nocturnal dyspnea   patient was advised to follow low-salt low-cholesterol diet    ideally I want to keep systolic blood pressure below 263 mmHg, patient was asked to check blood pressure one times a week and give me a report on that.  Patient will be follow-up in 3 months  or earlier as needed, patient will call me back for any change in the cardiovascular symptoms Patient was advised to buy a book from local bookstore concerning blood pressure and read several chapters  every day.  This will be supplemented by some of the material we will give him from the office.  Patient should also utilize other resources like YouTube and Internet to learn more about the blood pressure and the diet.        Musculoskeletal and Integument   Psoriasis and similar disorder    Under control        Other   Obesity (BMI 30-39.9)    - I encouraged the patient to lose weight.  - I educated them on making healthy dietary choices including eating more fruits and vegetables and less fried foods. - I encouraged the patient to exercise more, and educated on the benefits of exercise including weight loss, diabetes prevention, and hypertension prevention.   Dietary counseling with a registered dietician  Referral to a weight management support group (e.g. Weight Watchers, Overeaters Anonymous)  If your BMI is greater than 29 or you have gained more than 15 pounds you should work on weight loss.  Attend a healthy cooking class       Cervical (neck) region somatic dysfunction    Stable       No orders of the defined types were placed in this encounter.   Follow-up: No follow-ups on file.    Corky Downs, MD

## 2022-07-19 NOTE — Assessment & Plan Note (Signed)

## 2022-07-19 NOTE — Assessment & Plan Note (Signed)

## 2022-07-21 NOTE — Progress Notes (Signed)
Chief Complaint  Patient presents with   Gynecologic Exam    No concerns     HPI:      [name] is a 41 y.o. [id number] who LMP was Patient's last menstrual period was 07/08/2022 (approximate)., presents today for her NP> 3 yrs annual examination. Her menses are regular every 28-30 days, lasting 4-5 days, light to mod flow, mild Dysmenorrhea improved with NSAIDs. She has had a couple episodes of BTB vs menses Q3 wks. She is s/p endometrial ablation due to menorrhagia. Bleeding is improved.   Sex activity: single partner, contraception - tubal ligation.  Last Pap: February 06, 2015  Results were: no abnormalities /neg HPV DNA  Hx of STDs: none  Last mammogram: at [facility] per pt in past, not seen in Epic/Care everywhere There is no FH of breast cancer. There is a FH of ovarian cancer in her mat grt aunt, genetic testing not indicated for pt. The patient does do self-breast exams. S/p breast reduction last yr.    Tobacco use: The patient denies current or previous tobacco use. Alcohol use: none No drug use Exercise: mod active   She does get adequate calcium but not Vitamin D in her diet.   Labs with PCP.    Past Medical History:  Diagnosis Date   Dysmenorrhea    History of hypertension    due to phentermine   Seasonal allergies     Past Surgical History:  Procedure Laterality Date   CESAREAN SECTION  02/05/2005   CESAREAN SECTION  01/04/2008   CHOLECYSTECTOMY     DENTAL SURGERY     Dental implant    LAPAROSCOPY     TONSILLECTOMY     TUBAL LIGATION  2009    Family History  Problem Relation Age of Onset   Ovarian cancer Maternal Aunt        10s   Cancer Neg Hx    Diabetes Neg Hx    Hyperlipidemia Neg Hx    Hypertension Neg Hx    Stroke Neg Hx    Breast cancer Neg Hx     Social History   Socioeconomic History   Marital status: Married    Spouse name: Not on file   Number of children: 3   Years of education: Not on file   Highest education level:  Not on file  Occupational History   Not on file  Tobacco Use   Smoking status: Former   Smokeless tobacco: Never  Vaping Use   Vaping Use: Never used  Substance and Sexual Activity   Alcohol use: Yes    Comment: light   Drug use: No   Sexual activity: Yes    Birth control/protection: Surgical    Comment: Tubal ligation   Other Topics Concern   Not on file  Social History Narrative   Not on file   Social Determinants of Health   Financial Resource Strain: Not on file  Food Insecurity: Not on file  Transportation Needs: Not on file  Physical Activity: Not on file  Stress: Not on file  Social Connections: Not on file  Intimate Partner Violence: Not on file    Current Outpatient Medications on File Prior to Visit  Medication Sig Dispense Refill   buPROPion (WELLBUTRIN SR) 150 MG 12 hr tablet TAKE 1 TABLET BY MOUTH TWICE A DAY 180 tablet 2   clobetasol cream (TEMOVATE) 0.05 % Apply 1 application topically 2 (two) times daily. 45 g 4   diclofenac  Sodium (VOLTAREN) 1 % GEL Apply 2 g topically 4 (four) times daily. 50 g 1   EPINEPHrine 0.3 mg/0.3 mL IJ SOAJ injection Inject 0.3 mg into the muscle as needed. 1 each 3   hydrochlorothiazide (HYDRODIURIL) 25 MG tablet Take 1 tablet (25 mg total) by mouth daily. 90 tablet 3   losartan (COZAAR) 50 MG tablet Take 2 tablets (100 mg total) by mouth daily. 180 tablet 3   ondansetron (ZOFRAN) 8 MG tablet Take 1 tablet (8 mg total) by mouth every 8 (eight) hours as needed for nausea or vomiting. 30 tablet 1   OZEMPIC, 1 MG/DOSE, 4 MG/3ML SOPN Inject into the skin.     zolpidem (AMBIEN) 10 MG tablet Take 1 tablet (10 mg total) by mouth daily. 30 tablet 2   No current facility-administered medications on file prior to visit.      ROS:  Review of Systems  Constitutional:  Negative for fatigue, fever and unexpected weight change.  Respiratory:  Negative for cough, shortness of breath and wheezing.   Cardiovascular:  Negative for chest  pain, palpitations and leg swelling.  Gastrointestinal:  Positive for diarrhea and nausea. Negative for blood in stool, constipation and vomiting.  Endocrine: Negative for cold intolerance, heat intolerance and polyuria.  Genitourinary:  Positive for vaginal bleeding. Negative for dyspareunia, dysuria, flank pain, frequency, genital sores, hematuria, menstrual problem, pelvic pain, urgency, vaginal discharge and vaginal pain.  Musculoskeletal:  Negative for back pain, joint swelling and myalgias.  Skin:  Negative for rash.  Neurological:  Negative for dizziness, syncope, light-headedness, numbness and headaches.  Hematological:  Negative for adenopathy.  Psychiatric/Behavioral:  Negative for agitation, confusion, sleep disturbance and suicidal ideas. The patient is not nervous/anxious.      Objective: BP 110/72   Ht 5\' 4"  (1.626 m)   Wt 168 lb (76.2 kg)   LMP 07/08/2022 (Approximate)   BMI 28.84 kg/m    Physical Exam Constitutional:      Appearance: She is well-developed.  Genitourinary:     Vulva normal.     Right Labia: No rash, tenderness or lesions.    Left Labia: No tenderness, lesions or rash.    Vaginal bleeding present.     No vaginal discharge, erythema or tenderness.      Right Adnexa: not tender and no mass present.    Left Adnexa: not tender and no mass present.    No cervical motion tenderness, friability or polyp.     Uterus is not enlarged or tender.  Breasts:    Right: No mass, nipple discharge, skin change or tenderness.     Left: No mass, nipple discharge, skin change or tenderness.  Neck:     Thyroid: No thyromegaly.  Cardiovascular:     Rate and Rhythm: Normal rate and regular rhythm.     Heart sounds: Normal heart sounds. No murmur heard. Pulmonary:     Effort: Pulmonary effort is normal.     Breath sounds: Normal breath sounds.  Abdominal:     Palpations: Abdomen is soft.     Tenderness: There is no abdominal tenderness. There is no guarding or  rebound.  Musculoskeletal:        General: Normal range of motion.     Cervical back: Normal range of motion.  Lymphadenopathy:     Cervical: No cervical adenopathy.  Neurological:     General: No focal deficit present.     Mental Status: She is alert and oriented to person, place, and time.  Cranial Nerves: No cranial nerve deficit.  Skin:    General: Skin is warm and dry.  Psychiatric:        Mood and Affect: Mood normal.        Behavior: Behavior normal.        Thought Content: Thought content normal.        Judgment: Judgment normal.  Vitals reviewed.     Assessment/Plan: Encounter for annual routine gynecological examination  Cervical cancer screening - Plan: Cytology - PAP  Screening for HPV (human papillomavirus) - Plan: Cytology - PAP  Encounter for screening mammogram for malignant neoplasm of breast - Plan: MM 3D SCREEN BREAST BILATERAL; pt to schedule mammo  Irregular menses--few episodes; unsure if Q3 wks vs AUB. Pt to follow and f/u if AUB for GYN u/s.             GYN counsel breast self exam, diet/exercise, calcium and Vit D supp     F/U  Return in about 1 year (around 07/23/2023).  Victoria Mason B. Carolann Brazell, PA-C 07/22/2022 9:33 AM

## 2022-07-22 ENCOUNTER — Encounter: Payer: Self-pay | Admitting: Obstetrics and Gynecology

## 2022-07-22 ENCOUNTER — Ambulatory Visit (INDEPENDENT_AMBULATORY_CARE_PROVIDER_SITE_OTHER): Payer: Federal, State, Local not specified - PPO | Admitting: Obstetrics and Gynecology

## 2022-07-22 ENCOUNTER — Other Ambulatory Visit (HOSPITAL_COMMUNITY)
Admission: RE | Admit: 2022-07-22 | Discharge: 2022-07-22 | Disposition: A | Discharge status: Home / Self Care | Payer: Federal, State, Local not specified - PPO | Source: Ambulatory Visit | Attending: Obstetrics and Gynecology | Admitting: Obstetrics and Gynecology

## 2022-07-22 VITALS — BP 110/72 | Ht 64.0 in | Wt 168.0 lb

## 2022-07-22 DIAGNOSIS — Z1231 Encounter for screening mammogram for malignant neoplasm of breast: Secondary | ICD-10-CM | POA: Diagnosis not present

## 2022-07-22 DIAGNOSIS — Z124 Encounter for screening for malignant neoplasm of cervix: Secondary | ICD-10-CM | POA: Diagnosis not present

## 2022-07-22 DIAGNOSIS — N926 Irregular menstruation, unspecified: Secondary | ICD-10-CM

## 2022-07-22 DIAGNOSIS — Z1151 Encounter for screening for human papillomavirus (HPV): Secondary | ICD-10-CM | POA: Diagnosis not present

## 2022-07-22 DIAGNOSIS — Z01419 Encounter for gynecological examination (general) (routine) without abnormal findings: Secondary | ICD-10-CM | POA: Diagnosis not present

## 2022-07-22 NOTE — Patient Instructions (Signed)
I value your feedback and you entrusting us with your care. If you get a [place] patient survey, I would appreciate you taking the time to let us know about your experience today. Thank you!  [facility]: [phone]  [facility]: [phone]    

## 2022-07-26 ENCOUNTER — Ambulatory Visit: Payer: 59 | Admitting: Obstetrics and Gynecology

## 2022-07-26 LAB — CYTOLOGY - PAP
Comment: NEGATIVE
Diagnosis: NEGATIVE
High risk HPV: NEGATIVE

## 2022-08-08 ENCOUNTER — Other Ambulatory Visit: Payer: Self-pay

## 2022-08-08 MED ORDER — ZOLPIDEM TARTRATE 10 MG PO TABS: 10.0000 mg | ORAL_TABLET | Freq: Every day | ORAL | 2 refills | Status: DC

## 2022-08-23 ENCOUNTER — Other Ambulatory Visit: Payer: Self-pay | Admitting: Internal Medicine

## 2022-08-25 ENCOUNTER — Other Ambulatory Visit: Payer: Self-pay | Admitting: *Deleted

## 2022-08-25 MED ORDER — OZEMPIC (1 MG/DOSE) 4 MG/3ML ~~LOC~~ SOPN: 1.0000 mg | PEN_INJECTOR | SUBCUTANEOUS | 6 refills | Status: DC

## 2022-09-19 ENCOUNTER — Other Ambulatory Visit: Payer: Self-pay | Admitting: Internal Medicine

## 2022-11-05 ENCOUNTER — Other Ambulatory Visit: Payer: Self-pay | Admitting: Internal Medicine

## 2022-11-05 DIAGNOSIS — F329 Major depressive disorder, single episode, unspecified: Secondary | ICD-10-CM

## 2022-11-08 ENCOUNTER — Ambulatory Visit: Payer: Federal, State, Local not specified - PPO | Admitting: Internal Medicine

## 2022-11-09 ENCOUNTER — Other Ambulatory Visit: Payer: Self-pay

## 2022-11-11 ENCOUNTER — Other Ambulatory Visit: Payer: Self-pay | Admitting: Internal Medicine

## 2022-11-11 ENCOUNTER — Other Ambulatory Visit: Payer: Self-pay

## 2022-11-11 MED ORDER — ZOLPIDEM TARTRATE 10 MG PO TABS: 10.0000 mg | ORAL_TABLET | Freq: Every day | ORAL | 2 refills | Status: DC

## 2022-11-28 ENCOUNTER — Ambulatory Visit (INDEPENDENT_AMBULATORY_CARE_PROVIDER_SITE_OTHER): Payer: Federal, State, Local not specified - PPO | Admitting: Internal Medicine

## 2022-11-28 ENCOUNTER — Encounter: Payer: Self-pay | Admitting: Internal Medicine

## 2022-11-28 VITALS — BP 128/88 | HR 89 | Temp 97.8°F | Resp 97 | Ht 64.0 in | Wt 162.8 lb

## 2022-11-28 DIAGNOSIS — I10 Essential (primary) hypertension: Secondary | ICD-10-CM

## 2022-11-28 DIAGNOSIS — E669 Obesity, unspecified: Secondary | ICD-10-CM | POA: Diagnosis not present

## 2022-11-28 DIAGNOSIS — L409 Psoriasis, unspecified: Secondary | ICD-10-CM | POA: Diagnosis not present

## 2022-11-28 DIAGNOSIS — J302 Other seasonal allergic rhinitis: Secondary | ICD-10-CM | POA: Diagnosis not present

## 2022-11-28 DIAGNOSIS — M5440 Lumbago with sciatica, unspecified side: Secondary | ICD-10-CM

## 2022-11-28 NOTE — Progress Notes (Signed)
Established Patient Office Visit  Subjective:  Patient ID: Victoria Mason, female    DOB: Mar 24, 1981  Age: 41 y.o. MRN: [id number]  CC:  Chief Complaint  Patient presents with   Hypertension   Sciatica    Hypertension  Back Pain This is a new problem. The current episode started yesterday. The problem has been gradually worsening since onset. The pain is present in the gluteal and lumbar spine. The quality of the pain is described as shooting. The pain is at a severity of 7/10. The pain is severe. The symptoms are aggravated by bending. Stiffness is present In the morning.    Victoria Mason presents focheck up Past Medical History:  Diagnosis Date   Dysmenorrhea    History of hypertension    due to phentermine   Seasonal allergies     Past Surgical History:  Procedure Laterality Date   CESAREAN SECTION  02/05/2005   CESAREAN SECTION  01/04/2008   CHOLECYSTECTOMY     DENTAL SURGERY     Dental implant    LAPAROSCOPY     TONSILLECTOMY     TUBAL LIGATION  2009    Family History  Problem Relation Age of Onset   Ovarian cancer Maternal Aunt        85s   Cancer Neg Hx    Diabetes Neg Hx    Hyperlipidemia Neg Hx    Hypertension Neg Hx    Stroke Neg Hx    Breast cancer Neg Hx     Social History   Socioeconomic History   Marital status: Married    Spouse name: Not on file   Number of children: 3   Years of education: Not on file   Highest education level: Not on file  Occupational History   Not on file  Tobacco Use   Smoking status: Former   Smokeless tobacco: Never  Vaping Use   Vaping Use: Never used  Substance and Sexual Activity   Alcohol use: Yes    Comment: light   Drug use: No   Sexual activity: Yes    Birth control/protection: Surgical    Comment: Tubal ligation   Other Topics Concern   Not on file  Social History Narrative   Not on file   Social Determinants of Health   Financial Resource Strain: Not on file  Food Insecurity: Not on file   Transportation Needs: Not on file  Physical Activity: Not on file  Stress: Not on file  Social Connections: Not on file  Intimate Partner Violence: Not on file     Current Outpatient Medications:    buPROPion (WELLBUTRIN SR) 150 MG 12 hr tablet, TAKE ONE TABLET BY MOUTH TWICE A DAY, Disp: 180 tablet, Rfl: 1   clobetasol cream (TEMOVATE) 0.05 %, Apply 1 application topically 2 (two) times daily., Disp: 45 g, Rfl: 4   diclofenac Sodium (VOLTAREN) 1 % GEL, Apply 2 g topically 4 (four) times daily., Disp: 50 g, Rfl: 1   EPINEPHrine 0.3 mg/0.3 mL IJ SOAJ injection, Inject 0.3 mg into the muscle as needed., Disp: 1 each, Rfl: 3   hydrochlorothiazide (HYDRODIURIL) 25 MG tablet, TAKE ONE TABLET BY MOUTH DAILY, Disp: 90 tablet, Rfl: 1   losartan (COZAAR) 50 MG tablet, Take 2 tablets (100 mg total) by mouth daily., Disp: 180 tablet, Rfl: 3   ondansetron (ZOFRAN) 8 MG tablet, TAKE ONE TABLET BY MOUTH EVERY 8 HOURS AS NEEDED FOR NAUSEA AND VOMITING, Disp: 30 tablet, Rfl: 1   OZEMPIC,  1 MG/DOSE, 4 MG/3ML SOPN, Inject 1 mg into the skin once a week., Disp: 3 mL, Rfl: 6   zolpidem (AMBIEN) 10 MG tablet, Take 1 tablet (10 mg total) by mouth daily., Disp: 30 tablet, Rfl: 2   Allergies  Allergen Reactions   Ace Inhibitors Anaphylaxis   Morphine And Related    Pollen Extract Other (See Comments)    Nasal congestion    ROS Review of Systems  Constitutional: Negative.   HENT: Negative.    Eyes: Negative.   Respiratory: Negative.    Cardiovascular: Negative.   Gastrointestinal: Negative.   Endocrine: Negative.   Genitourinary: Negative.   Musculoskeletal:  Positive for back pain.  Skin: Negative.   Allergic/Immunologic: Negative.   Neurological: Negative.   Hematological: Negative.   Psychiatric/Behavioral: Negative.    All other systems reviewed and are negative.     Objective:    Physical Exam Vitals reviewed.  Constitutional:      Appearance: Normal appearance.  HENT:      Mouth/Throat:     Mouth: Mucous membranes are moist.  Eyes:     Pupils: Pupils are equal, round, and reactive to light.  Neck:     Vascular: No carotid bruit.  Cardiovascular:     Rate and Rhythm: Normal rate and regular rhythm.     Pulses: Normal pulses.     Heart sounds: Normal heart sounds.  Pulmonary:     Effort: Pulmonary effort is normal.     Breath sounds: Normal breath sounds.  Abdominal:     General: Bowel sounds are normal.     Palpations: Abdomen is soft. There is no hepatomegaly, splenomegaly or mass.     Tenderness: There is no abdominal tenderness.     Hernia: No hernia is present.  Musculoskeletal:        General: No tenderness.     Cervical back: Neck supple.     Right lower leg: No edema.     Left lower leg: No edema.  Skin:    Findings: No rash.  Neurological:     Mental Status: She is alert and oriented to person, place, and time.     Motor: No weakness.  Psychiatric:        Mood and Affect: Mood and affect normal.        Behavior: Behavior normal.     BP 128/88   Pulse 89   Temp 97.8 F (36.6 C) (Temporal)   Resp (!) 97   Ht 5\' 4"  (1.626 m)   Wt 162 lb 12.8 oz (73.8 kg)   BMI 27.94 kg/m  Wt Readings from Last 3 Encounters:  11/28/22 162 lb 12.8 oz (73.8 kg)  07/22/22 168 lb (76.2 kg)  07/19/22 167 lb 1.6 oz (75.8 kg)     Health Maintenance Due  Topic Date Due   HIV Screening  Never done   Hepatitis C Screening  Never done   INFLUENZA VACCINE  07/26/2022    There are no preventive care reminders to display for this patient.  Lab Results  Component Value Date   TSH 0.88 01/29/2021   Lab Results  Component Value Date   WBC 7.3 01/29/2021   HGB 14.0 01/29/2021   HCT 40.8 01/29/2021   MCV 90.5 01/29/2021   PLT 182 01/29/2021   Lab Results  Component Value Date   NA 137 01/29/2021   K 4.1 01/29/2021   CO2 23 01/29/2021   GLUCOSE 135 (H) 01/29/2021   BUN 9 01/29/2021  CREATININE 0.66 01/29/2021   BILITOT 1.2 01/29/2021    AST 20 01/29/2021   ALT 18 01/29/2021   PROT 6.9 01/29/2021   CALCIUM 9.2 01/29/2021   Lab Results  Component Value Date   CHOL 150 01/29/2021   Lab Results  Component Value Date   HDL 41 (L) 01/29/2021   Lab Results  Component Value Date   LDLCALC 88 01/29/2021   Lab Results  Component Value Date   TRIG 115 01/29/2021   Lab Results  Component Value Date   CHOLHDL 3.7 01/29/2021   No results found for: "HGBA1C"    Assessment & Plan:   Problem List Items Addressed This Visit       Cardiovascular and Mediastinum   Essential hypertension - Primary     Patient denies any chest pain or shortness of breath there is no history of palpitation or paroxysmal nocturnal dyspnea   patient was advised to follow low-salt low-cholesterol diet    ideally I want to keep systolic blood pressure below 563 mmHg, patient was asked to check blood pressure one times a week and give me a report on that.  Patient will be follow-up in 3 months  or earlier as needed, patient will call me back for any change in the cardiovascular symptoms Patient was advised to buy a book from local bookstore concerning blood pressure and read several chapters  every day.  This will be supplemented by some of the material we will give him from the office.  Patient should also utilize other resources like YouTube and Internet to learn more about the blood pressure and the diet.        Nervous and Auditory   Lumbago of lumbar region with sciatica    - Patient's back pain is under control with medication.  - Encouraged the patient to stretch or do yoga as able to help with back pain        Musculoskeletal and Integument   Psoriasis and similar disorder     Other   Seasonal allergies    Stable at the present time      Obesity (BMI 30-39.9)    No orders of the defined types were placed in this encounter.   Follow-up: No follow-ups on file.    Corky Downs, MD

## 2022-11-28 NOTE — Assessment & Plan Note (Signed)
Stable at the present time. 

## 2022-11-28 NOTE — Assessment & Plan Note (Signed)

## 2022-11-28 NOTE — Assessment & Plan Note (Signed)
-   Patient's back pain is under control with medication.  - Encouraged the patient to stretch or do yoga as able to help with back pain 

## 2022-12-05 ENCOUNTER — Other Ambulatory Visit: Payer: Self-pay | Admitting: Internal Medicine

## 2022-12-05 DIAGNOSIS — I10 Essential (primary) hypertension: Secondary | ICD-10-CM

## 2023-02-23 ENCOUNTER — Telehealth: Payer: Self-pay

## 2023-02-23 NOTE — Telephone Encounter (Signed)
Pharmacy Patient Advocate Encounter  Received notification from [facility] that the request for prior authorization for Ozempic has been denied due to .

## 2023-07-27 ENCOUNTER — Emergency Department: Payer: No Typology Code available for payment source

## 2023-07-27 ENCOUNTER — Other Ambulatory Visit: Payer: Self-pay

## 2023-07-27 DIAGNOSIS — Y9369 Activity, other involving other sports and athletics played as a team or group: Secondary | ICD-10-CM | POA: Diagnosis not present

## 2023-07-27 DIAGNOSIS — S83522A Sprain of posterior cruciate ligament of left knee, initial encounter: Secondary | ICD-10-CM | POA: Diagnosis not present

## 2023-07-27 DIAGNOSIS — X509XXA Other and unspecified overexertion or strenuous movements or postures, initial encounter: Secondary | ICD-10-CM | POA: Diagnosis not present

## 2023-07-27 DIAGNOSIS — S83422A Sprain of lateral collateral ligament of left knee, initial encounter: Secondary | ICD-10-CM | POA: Diagnosis not present

## 2023-07-27 DIAGNOSIS — M25562 Pain in left knee: Secondary | ICD-10-CM | POA: Diagnosis present

## 2023-07-27 LAB — POC URINE PREG, ED: Preg Test, Ur: NEGATIVE

## 2023-07-27 NOTE — ED Triage Notes (Signed)
Pt presents to ER with c/o left knee pain that started around 1900 when pt was playing kickball.  Pt states she was running, and her left knee bent inward when she was running to a base.  Pt states pain is mostly on inside of her left knee, and every movement makes it worse.  No swelling or deformity noted at this time, and pt is otherwise A&O x4 and in NAD

## 2023-07-28 ENCOUNTER — Emergency Department
Admission: EM | Admit: 2023-07-28 | Discharge: 2023-07-28 | Disposition: A | Discharge status: Home / Self Care | Payer: No Typology Code available for payment source | Attending: Emergency Medicine | Admitting: Emergency Medicine

## 2023-07-28 DIAGNOSIS — S83422A Sprain of lateral collateral ligament of left knee, initial encounter: Secondary | ICD-10-CM

## 2023-07-28 DIAGNOSIS — S83522A Sprain of posterior cruciate ligament of left knee, initial encounter: Secondary | ICD-10-CM

## 2023-07-28 MED ORDER — ONDANSETRON 4 MG PO TBDP: 4.0000 mg | ORAL_TABLET | Freq: Three times a day (TID) | ORAL | 0 refills | Status: DC | PRN

## 2023-07-28 MED ORDER — OXYCODONE-ACETAMINOPHEN 5-325 MG PO TABS: 1.0000 | ORAL_TABLET | Freq: Four times a day (QID) | ORAL | 0 refills | Status: DC | PRN

## 2023-07-28 MED ORDER — NAPROXEN 500 MG PO TABS: 500.0000 mg | ORAL_TABLET | Freq: Two times a day (BID) | ORAL | 0 refills | Status: AC

## 2023-07-28 MED ORDER — OXYCODONE-ACETAMINOPHEN 5-325 MG PO TABS
2.0000 | ORAL_TABLET | Freq: Once | ORAL | Status: AC
  Administered 2023-07-28: 2 via ORAL
  Filled 2023-07-28: qty 2

## 2023-07-28 MED ORDER — ONDANSETRON 4 MG PO TBDP
4.0000 mg | ORAL_TABLET | Freq: Once | ORAL | Status: AC
  Administered 2023-07-28: 4 mg via ORAL
  Filled 2023-07-28: qty 1

## 2023-07-28 NOTE — ED Provider Notes (Signed)
[facility] Provider Note    Event Date/Time   First MD Initiated Contact with Patient 07/28/23 [phone]     (approximate)   History   Knee Pain   HPI  Victoria Mason is a 42 y.o. female here with left knee pain.  The patient was playing kickball today when she tried to stop after running at a base.  She states her leg hyperextended backwards and she felt immediate onset of severe pain.  She been able to walk since then.  The pain is severe, localized to the left knee, worse with any kind of movement or palpation.  She been able to bear weight on it.  No history of previous injury to the area.  No numbness or weakness.     Physical Exam   Triage Vital Signs: ED Triage Vitals  Encounter Vitals Group     BP 07/27/23 2250 (!) 150/96     Systolic BP Percentile --      Diastolic BP Percentile --      Pulse Rate 07/27/23 2250 (!) 112     Resp 07/27/23 2250 (!) 21     Temp 07/27/23 2250 98.4 F (36.9 C)     Temp Source 07/27/23 2250 Oral     SpO2 07/27/23 2250 99 %     Weight 07/27/23 2254 153 lb (69.4 kg)     Height 07/27/23 2254 5\' 4"  (1.626 m)     Head Circumference --      Peak Flow --      Pain Score 07/27/23 2254 6     Pain Loc --      Pain Education --      Exclude from Growth Chart --     Most recent vital signs: Vitals:   07/27/23 2250  BP: (!) 150/96  Pulse: (!) 112  Resp: (!) 21  Temp: 98.4 F (36.9 C)  SpO2: 99%     General: Awake, no distress.  CV:  Good peripheral perfusion.  Resp:  Normal work of breathing.  Abd:  No distention.  Other:  Moderate swelling and tenderness to palpation of the left anterior knee with small joint effusion appreciated.  There is significant tenderness along the posterior lateral aspect of the joint line, as well as lateral collateral ligament area.  No overt ligamentous instability although the testing is limited somewhat by pain.  Distal strength and sensation is fully intact.  2+ DP and PT pulses.   Normal cap refill.   ED Results / Procedures / Treatments   Labs (all labs ordered are listed, but only abnormal results are displayed) Labs Reviewed  POC URINE PREG, ED     EKG    RADIOLOGY DG knee left: Negative DG hip left negative   I also independently reviewed and agree with radiologist interpretations.   PROCEDURES:  Critical Care performed: No   MEDICATIONS ORDERED IN ED: Medications  oxyCODONE-acetaminophen (PERCOCET/ROXICET) 5-325 MG per tablet 2 tablet (2 tablets Oral Given 07/28/23 0251)  ondansetron (ZOFRAN-ODT) disintegrating tablet 4 mg (4 mg Oral Given 07/28/23 0251)     IMPRESSION / MDM / ASSESSMENT AND PLAN / ED COURSE  I reviewed the triage vital signs and the nursing notes.                              Differential diagnosis includes, but is not limited to, knee sprain, possible PCL or LCL injury, meniscal injury, fracture,  unlikely dislocation  Patient's presentation is most consistent with acute complicated illness / injury requiring diagnostic workup.   42 year old female here with left knee pain after fall while playing kickball.  Exam is concerning for possible LCL or PCL injury or sprain, though testing somewhat limited due to the degree of discomfort.  She has no evidence suggest significant dislocation and she has intact popliteal as well as DP and PT pulses.  No significant distal edema.  She is neurovascularly intact.  Plain films reviewed and are negative.  Will place in a hinged knee brace, advised outpatient orthopedic follow-up, and discharged with pain control.   FINAL CLINICAL IMPRESSION(S) / ED DIAGNOSES   Final diagnoses:  Sprain of lateral collateral ligament of left knee, initial encounter  Sprain of posterior cruciate ligament of left knee, initial encounter     Rx / DC Orders   ED Discharge Orders     None        Note:  This document was prepared using Dragon voice recognition software and may include  unintentional dictation errors.   Shaune Pollack, MD 07/28/23 [phone]

## 2023-07-28 NOTE — Discharge Instructions (Signed)
Wear your brace at all times until seen by Orthopedics  If you have difficulty getting in with Ortho, please call your primary for follow-up and help.  ELEVATE your leg as much as possible  Take the medications as prescribed  Use crutches to keep weight off the knee until follow-up

## 2023-07-28 NOTE — ED Notes (Signed)
ED Provider at bedside. 

## 2023-11-10 ENCOUNTER — Other Ambulatory Visit: Payer: Self-pay | Admitting: Internal Medicine

## 2023-11-10 DIAGNOSIS — Z1231 Encounter for screening mammogram for malignant neoplasm of breast: Secondary | ICD-10-CM

## 2023-12-08 ENCOUNTER — Ambulatory Visit
Admission: RE | Admit: 2023-12-08 | Discharge: 2023-12-08 | Disposition: A | Discharge status: Home / Self Care | Payer: No Typology Code available for payment source | Source: Ambulatory Visit | Attending: Internal Medicine | Admitting: Internal Medicine

## 2023-12-08 DIAGNOSIS — Z1231 Encounter for screening mammogram for malignant neoplasm of breast: Secondary | ICD-10-CM | POA: Insufficient documentation

## 2024-04-19 ENCOUNTER — Ambulatory Visit: Payer: Self-pay | Admitting: Internal Medicine

## 2024-04-25 ENCOUNTER — Encounter: Payer: Self-pay | Admitting: Internal Medicine

## 2024-04-25 ENCOUNTER — Ambulatory Visit: Payer: Self-pay | Admitting: Internal Medicine

## 2024-04-25 VITALS — BP 108/72 | HR 82 | Ht 64.0 in | Wt 177.2 lb

## 2024-04-25 DIAGNOSIS — F3111 Bipolar disorder, current episode manic without psychotic features, mild: Secondary | ICD-10-CM

## 2024-04-25 DIAGNOSIS — J302 Other seasonal allergic rhinitis: Secondary | ICD-10-CM

## 2024-04-25 DIAGNOSIS — L409 Psoriasis, unspecified: Secondary | ICD-10-CM | POA: Diagnosis not present

## 2024-04-25 DIAGNOSIS — E669 Obesity, unspecified: Secondary | ICD-10-CM

## 2024-04-25 DIAGNOSIS — I10 Essential (primary) hypertension: Secondary | ICD-10-CM | POA: Diagnosis not present

## 2024-04-25 DIAGNOSIS — Z1389 Encounter for screening for other disorder: Secondary | ICD-10-CM | POA: Diagnosis not present

## 2024-04-25 DIAGNOSIS — R7303 Prediabetes: Secondary | ICD-10-CM

## 2024-04-25 DIAGNOSIS — F329 Major depressive disorder, single episode, unspecified: Secondary | ICD-10-CM

## 2024-04-25 LAB — POCT URINALYSIS DIPSTICK
Bilirubin, UA: NEGATIVE
Blood, UA: NEGATIVE
Glucose, UA: NEGATIVE
Ketones, UA: NEGATIVE
Leukocytes, UA: NEGATIVE
Nitrite, UA: NEGATIVE
Protein, UA: NEGATIVE
Spec Grav, UA: 1.02 (ref 1.010–1.025)
Urobilinogen, UA: 0.2 U/dL
pH, UA: 7 (ref 5.0–8.0)

## 2024-04-25 MED ORDER — BUPROPION HCL ER (SR) 150 MG PO TB12: 150.0000 mg | ORAL_TABLET | Freq: Two times a day (BID) | ORAL | 1 refills | Status: DC

## 2024-04-25 MED ORDER — LOSARTAN POTASSIUM-HCTZ 100-25 MG PO TABS
1.0000 | ORAL_TABLET | Freq: Every day | ORAL | 3 refills | Status: AC
Start: 2024-04-25 — End: 2025-04-25

## 2024-04-25 NOTE — Progress Notes (Signed)
 New Patient Office Visit  Subjective   Patient ID: Victoria Mason, female    DOB: 12/03/1981  Age: 43 y.o. MRN: [id number]  CC:  Chief Complaint  Patient presents with   Establish Care    NPE    HPI Victoria Mason presents to establish care Previous Primary Care provider/office:   she does not have additional concerns to discuss today.   Patient comes in to establish PMD.  She is currently on some medications for treatment of her hypertension, depression and psoriasis.  Patient reports that she has run out of most most of her medications and needs refills.  She is also concerned that she has history of bipolar disorder and used to be treated by a psychiatrist which she has not seen for several years.  Currently she is only on Wellbutrin  and feels like it is not helping her enough.  She has difficulty sleeping at night especially because of her husband who is restless at night and disturbs her sleep.  She has been taking Ambien  in the past to help her sleep. Her PHQ-9/GAD score is 18/14.  Will send in an urgent psychiatry referral. Meanwhile add trazodone at bedtime.  Stop Ambien . Patient is also concerned about weight gain, due to dietary indiscretion as she has been traveling.  Strict diet control and exercise emphasized.    Outpatient Encounter Medications as of 04/25/2024  Medication Sig   losartan -hydrochlorothiazide  (HYZAAR) 100-25 MG tablet Take 1 tablet by mouth daily.   ondansetron  (ZOFRAN ) 8 MG tablet TAKE ONE TABLET BY MOUTH EVERY 8 HOURS AS NEEDED FOR NAUSEA AND VOMITING   [DISCONTINUED] buPROPion  (WELLBUTRIN  SR) 150 MG 12 hr tablet TAKE ONE TABLET BY MOUTH TWICE A DAY   [DISCONTINUED] hydrochlorothiazide  (HYDRODIURIL ) 25 MG tablet TAKE ONE TABLET BY MOUTH DAILY   [DISCONTINUED] losartan  (COZAAR ) 50 MG tablet TAKE TWO TABLETS BY MOUTH DAILY   [DISCONTINUED] phentermine  15 MG capsule Take 15 mg by mouth every morning.   [DISCONTINUED] zolpidem  (AMBIEN ) 10 MG tablet Take 1  tablet (10 mg total) by mouth daily.   buPROPion  (WELLBUTRIN  SR) 150 MG 12 hr tablet Take 1 tablet (150 mg total) by mouth 2 (two) times daily.   clobetasol  cream (TEMOVATE ) 0.05 % Apply 1 application topically 2 (two) times daily. (Patient not taking: Reported on 04/25/2024)   EPINEPHrine  0.3 mg/0.3 mL IJ SOAJ injection Inject 0.3 mg into the muscle as needed. (Patient not taking: Reported on 04/25/2024)   [DISCONTINUED] diclofenac  Sodium (VOLTAREN ) 1 % GEL Apply 2 g topically 4 (four) times daily. (Patient not taking: Reported on 04/25/2024)   [DISCONTINUED] ondansetron  (ZOFRAN -ODT) 4 MG disintegrating tablet Take 1 tablet (4 mg total) by mouth every 8 (eight) hours as needed for nausea or vomiting. (Patient not taking: Reported on 04/25/2024)   [DISCONTINUED] oxyCODONE -acetaminophen  (PERCOCET) 5-325 MG tablet Take 1-2 tablets by mouth every 6 (six) hours as needed for severe pain or moderate pain (no more than 6 tabs daily). (Patient not taking: Reported on 04/25/2024)   [DISCONTINUED] OZEMPIC , 1 MG/DOSE, 4 MG/3ML SOPN Inject 1 mg into the skin once a week. (Patient not taking: Reported on 04/25/2024)   No facility-administered encounter medications on file as of 04/25/2024.    Past Medical History:  Diagnosis Date   Dysmenorrhea    History of hypertension    due to phentermine    Psoriasis    Seasonal allergies     Past Surgical History:  Procedure Laterality Date   CESAREAN SECTION  02/05/2005   CESAREAN  SECTION  01/04/2008   CHOLECYSTECTOMY     DENTAL SURGERY     Dental implant    LAPAROSCOPY     REDUCTION MAMMAPLASTY Bilateral 2022   TONSILLECTOMY     TUBAL LIGATION  2009    Family History  Problem Relation Age of Onset   Ovarian cancer Maternal Aunt        15s   Cancer Neg Hx    Diabetes Neg Hx    Hyperlipidemia Neg Hx    Hypertension Neg Hx    Stroke Neg Hx    Breast cancer Neg Hx     Social History   Socioeconomic History   Marital status: Married    Spouse name: Not on  file   Number of children: 3   Years of education: Not on file   Highest education level: Not on file  Occupational History   Not on file  Tobacco Use   Smoking status: Former   Smokeless tobacco: Never  Vaping Use   Vaping status: Never Used  Substance and Sexual Activity   Alcohol use: Yes    Comment: light   Drug use: No   Sexual activity: Yes    Birth control/protection: Surgical    Comment: Tubal ligation   Other Topics Concern   Not on file  Social History Narrative   Not on file   Social Drivers of Health   Financial Resource Strain: Not on file  Food Insecurity: Not on file  Transportation Needs: Not on file  Physical Activity: Not on file  Stress: Not on file  Social Connections: Not on file  Intimate Partner Violence: Not on file    Review of Systems  Constitutional: Negative.  Negative for chills, diaphoresis, malaise/fatigue and weight loss.  HENT: Negative.  Negative for sore throat.   Eyes: Negative.   Respiratory: Negative.  Negative for cough and shortness of breath.   Cardiovascular: Negative.  Negative for chest pain, palpitations and leg swelling.  Gastrointestinal: Negative.  Negative for abdominal pain, constipation, diarrhea, heartburn, nausea and vomiting.  Genitourinary: Negative.  Negative for dysuria and flank pain.  Musculoskeletal: Negative.  Negative for joint pain and myalgias.  Skin: Negative.   Neurological: Negative.  Negative for dizziness, tingling, tremors, sensory change and headaches.  Endo/Heme/Allergies: Negative.   Psychiatric/Behavioral: Negative.  Negative for depression and suicidal ideas. The patient is not nervous/anxious.         Objective   BP 108/72   Pulse 82   Ht 5\' 4"  (1.626 m)   Wt 177 lb 3.2 oz (80.4 kg)   SpO2 99%   BMI 30.42 kg/m   Physical Exam Vitals and nursing note reviewed.  Constitutional:      Appearance: Normal appearance.  HENT:     Head: Normocephalic and atraumatic.     Nose: Nose  normal.     Mouth/Throat:     Mouth: Mucous membranes are moist.     Pharynx: Oropharynx is clear.  Eyes:     Conjunctiva/sclera: Conjunctivae normal.     Pupils: Pupils are equal, round, and reactive to light.  Cardiovascular:     Rate and Rhythm: Normal rate and regular rhythm.     Pulses: Normal pulses.     Heart sounds: Normal heart sounds. No murmur heard. Pulmonary:     Effort: Pulmonary effort is normal.     Breath sounds: Normal breath sounds. No wheezing.  Abdominal:     General: Bowel sounds are normal.  Palpations: Abdomen is soft.     Tenderness: There is no abdominal tenderness. There is no right CVA tenderness or left CVA tenderness.  Musculoskeletal:        General: Normal range of motion.     Cervical back: Normal range of motion.     Right lower leg: No edema.     Left lower leg: No edema.  Skin:    General: Skin is warm and dry.  Neurological:     General: No focal deficit present.     Mental Status: She is alert and oriented to person, place, and time.  Psychiatric:        Mood and Affect: Mood normal.        Behavior: Behavior normal.        Assessment & Plan:  Check labs today.  Adjust medications. Referral to psychiatrist. Problem List Items Addressed This Visit     Essential hypertension - Primary   Relevant Medications   losartan -hydrochlorothiazide  (HYZAAR) 100-25 MG tablet   Other Relevant Orders   CBC with Diff   CMP14+EGFR   Lipid Panel w/o Chol/HDL Ratio   Seasonal allergies   Obesity (BMI 30-39.9)   Relevant Orders   CMP14+EGFR   Psoriasis and similar disorder   Relevant Orders   CBC with Diff   Reactive depression   Relevant Medications   buPROPion  (WELLBUTRIN  SR) 150 MG 12 hr tablet   Bipolar 1 disorder, manic, mild (HCC)   Relevant Orders   Ambulatory referral to Psychiatry   Other Visit Diagnoses       Prediabetes       Relevant Orders   CMP14+EGFR   Lipid Panel w/o Chol/HDL Ratio   Hemoglobin A1c     Screening  for blood or protein in urine       Relevant Orders   POCT Urinalysis Dipstick (86578) (Completed)       Return in about 2 weeks (around 05/09/2024).   Total time spent: 30 minutes  Aisha Hove, MD  04/25/2024   This document may have been prepared by [facility] Voice Recognition software and as such may include unintentional dictation errors.

## 2024-04-26 LAB — CBC WITH DIFFERENTIAL/PLATELET
Basophils Absolute: 0.1 10*3/uL (ref 0.0–0.2)
Basos: 1 %
EOS (ABSOLUTE): 0.2 10*3/uL (ref 0.0–0.4)
Eos: 2 %
Hematocrit: 40 % (ref 34.0–46.6)
Hemoglobin: 13.7 g/dL (ref 11.1–15.9)
Immature Grans (Abs): 0 10*3/uL (ref 0.0–0.1)
Immature Granulocytes: 0 %
Lymphocytes Absolute: 3.1 10*3/uL (ref 0.7–3.1)
Lymphs: 37 %
MCH: 31.7 pg (ref 26.6–33.0)
MCHC: 34.3 g/dL (ref 31.5–35.7)
MCV: 93 fL (ref 79–97)
Monocytes Absolute: 0.5 10*3/uL (ref 0.1–0.9)
Monocytes: 6 %
Neutrophils Absolute: 4.4 10*3/uL (ref 1.4–7.0)
Neutrophils: 54 %
Platelets: 273 10*3/uL (ref 150–450)
RBC: 4.32 x10E6/uL (ref 3.77–5.28)
RDW: 12.5 % (ref 11.7–15.4)
WBC: 8.2 10*3/uL (ref 3.4–10.8)

## 2024-04-26 LAB — CMP14+EGFR
ALT: 23 IU/L (ref 0–32)
AST: 19 IU/L (ref 0–40)
Albumin: 4.4 g/dL (ref 3.9–4.9)
Alkaline Phosphatase: 84 IU/L (ref 44–121)
BUN/Creatinine Ratio: 17 (ref 9–23)
BUN: 12 mg/dL (ref 6–24)
Bilirubin Total: 0.6 mg/dL (ref 0.0–1.2)
CO2: 24 mmol/L (ref 20–29)
Calcium: 9.2 mg/dL (ref 8.7–10.2)
Chloride: 105 mmol/L (ref 96–106)
Creatinine, Ser: 0.7 mg/dL (ref 0.57–1.00)
Globulin, Total: 2.4 g/dL (ref 1.5–4.5)
Glucose: 97 mg/dL (ref 70–99)
Potassium: 4.6 mmol/L (ref 3.5–5.2)
Sodium: 142 mmol/L (ref 134–144)
Total Protein: 6.8 g/dL (ref 6.0–8.5)
eGFR: 109 mL/min/{1.73_m2} (ref >59)

## 2024-04-26 LAB — LIPID PANEL W/O CHOL/HDL RATIO
Cholesterol, Total: 204 mg/dL — ABNORMAL HIGH (ref 100–199)
HDL: 57 mg/dL (ref >39)
LDL Chol Calc (NIH): 131 mg/dL — ABNORMAL HIGH (ref 0–99)
Triglycerides: 87 mg/dL (ref 0–149)
VLDL Cholesterol Cal: 16 mg/dL (ref 5–40)

## 2024-04-26 LAB — HEMOGLOBIN A1C
Est. average glucose Bld gHb Est-mCnc: 117 mg/dL
Hgb A1c MFr Bld: 5.7 % — ABNORMAL HIGH (ref 4.8–5.6)

## 2024-05-10 ENCOUNTER — Ambulatory Visit: Admitting: Internal Medicine

## 2024-05-10 ENCOUNTER — Encounter: Payer: Self-pay | Admitting: Internal Medicine

## 2024-05-10 VITALS — BP 110/82 | HR 98 | Ht 64.0 in | Wt 174.4 lb

## 2024-05-10 DIAGNOSIS — F329 Major depressive disorder, single episode, unspecified: Secondary | ICD-10-CM | POA: Diagnosis not present

## 2024-05-10 DIAGNOSIS — R7303 Prediabetes: Secondary | ICD-10-CM

## 2024-05-10 DIAGNOSIS — J302 Other seasonal allergic rhinitis: Secondary | ICD-10-CM

## 2024-05-10 DIAGNOSIS — F5101 Primary insomnia: Secondary | ICD-10-CM | POA: Diagnosis not present

## 2024-05-10 DIAGNOSIS — E782 Mixed hyperlipidemia: Secondary | ICD-10-CM | POA: Diagnosis not present

## 2024-05-10 DIAGNOSIS — Z0130 Encounter for examination of blood pressure without abnormal findings: Secondary | ICD-10-CM

## 2024-05-10 MED ORDER — TRAZODONE HCL 50 MG PO TABS: 25.0000 mg | ORAL_TABLET | Freq: Every day | ORAL | 2 refills | Status: DC

## 2024-05-10 NOTE — Progress Notes (Signed)
 Established Patient Office Visit  Subjective:  Patient ID: Victoria Mason, female    DOB: [birth year]  Age: 43 y.o. MRN: [id number]  Chief Complaint  Patient presents with   Follow-up    2 week follow up    Patient comes in for follow-up today.  She is taking her medications as prescribed, but has not received a call from the psychiatrist.  Referral has been sent.  She is still on Wellbutrin , trazodone will be added to help her sleep at night.  Patient got Zepbound prescription from online and has already lost some weight on 2.5 mg/week, 2 doses only so far.  Denies nausea or vomiting, no abdominal discomfort and no constipation. Her blood pressure looks good also.    No other concerns at this time.   Past Medical History:  Diagnosis Date   Dysmenorrhea    History of hypertension    due to phentermine    Psoriasis    Seasonal allergies     Past Surgical History:  Procedure Laterality Date   CESAREAN SECTION  02/05/2005   CESAREAN SECTION  01/04/2008   CHOLECYSTECTOMY     DENTAL SURGERY     Dental implant    LAPAROSCOPY     REDUCTION MAMMAPLASTY Bilateral 2022   TONSILLECTOMY     TUBAL LIGATION  2009    Social History   Socioeconomic History   Marital status: Married    Spouse name: Not on file   Number of children: 3   Years of education: Not on file   Highest education level: Not on file  Occupational History   Not on file  Tobacco Use   Smoking status: Former   Smokeless tobacco: Never  Vaping Use   Vaping status: Never Used  Substance and Sexual Activity   Alcohol use: Yes    Comment: light   Drug use: No   Sexual activity: Yes    Birth control/protection: Surgical    Comment: Tubal ligation   Other Topics Concern   Not on file  Social History Narrative   Not on file   Social Drivers of Health   Financial Resource Strain: Not on file  Food Insecurity: Not on file  Transportation Needs: Not on file  Physical Activity: Not on file  Stress: Not  on file  Social Connections: Not on file  Intimate Partner Violence: Not on file    Family History  Problem Relation Age of Onset   Ovarian cancer Maternal Aunt        83s   Cancer Neg Hx    Diabetes Neg Hx    Hyperlipidemia Neg Hx    Hypertension Neg Hx    Stroke Neg Hx    Breast cancer Neg Hx     Allergies  Allergen Reactions   Ace Inhibitors Anaphylaxis   Black Buyer, retail (Non-Screening) Anaphylaxis and Hives   Cat Dander    Morphine And Codeine    Pollen Extract Other (See Comments)    Nasal congestion    Outpatient Medications Prior to Visit  Medication Sig   buPROPion  (WELLBUTRIN  SR) 150 MG 12 hr tablet Take 1 tablet (150 mg total) by mouth 2 (two) times daily.   losartan -hydrochlorothiazide  (HYZAAR) 100-25 MG tablet Take 1 tablet by mouth daily.   ondansetron  (ZOFRAN ) 8 MG tablet TAKE ONE TABLET BY MOUTH EVERY 8 HOURS AS NEEDED FOR NAUSEA AND VOMITING   ZEPBOUND 2.5 MG/0.5ML Pen Inject 2.5 mg into the skin once a week.   EPINEPHrine   0.3 mg/0.3 mL IJ SOAJ injection Inject 0.3 mg into the muscle as needed. (Patient not taking: Reported on 05/10/2024)   [DISCONTINUED] clobetasol  cream (TEMOVATE ) 0.05 % Apply 1 application topically 2 (two) times daily. (Patient not taking: Reported on 05/10/2024)   No facility-administered medications prior to visit.    Review of Systems  Constitutional:  Positive for weight loss. Negative for chills, fever and malaise/fatigue.  HENT: Negative.  Negative for congestion, ear pain and sore throat.   Eyes: Negative.   Respiratory: Negative.  Negative for cough and shortness of breath.   Cardiovascular: Negative.  Negative for chest pain, palpitations and leg swelling.  Gastrointestinal: Negative.  Negative for abdominal pain, constipation, diarrhea, heartburn, nausea and vomiting.  Genitourinary: Negative.  Negative for dysuria and flank pain.  Musculoskeletal: Negative.  Negative for joint pain and myalgias.  Skin:  Negative.   Neurological: Negative.  Negative for dizziness and headaches.  Endo/Heme/Allergies: Negative.   Psychiatric/Behavioral: Negative.  Negative for depression and suicidal ideas. The patient is not nervous/anxious.        Objective:   BP 110/82   Pulse 98   Ht 5\' 4"  (1.626 m)   Wt 174 lb 6.4 oz (79.1 kg)   SpO2 99%   BMI 29.94 kg/m   Vitals:   05/10/24 1139  BP: 110/82  Pulse: 98  Height: 5\' 4"  (1.626 m)  Weight: 174 lb 6.4 oz (79.1 kg)  SpO2: 99%  BMI (Calculated): 29.92    Physical Exam Vitals and nursing note reviewed.  Constitutional:      Appearance: Normal appearance.  HENT:     Head: Normocephalic and atraumatic.     Nose: Nose normal.     Mouth/Throat:     Mouth: Mucous membranes are moist.     Pharynx: Oropharynx is clear.  Eyes:     Conjunctiva/sclera: Conjunctivae normal.     Pupils: Pupils are equal, round, and reactive to light.  Cardiovascular:     Rate and Rhythm: Normal rate and regular rhythm.     Pulses: Normal pulses.     Heart sounds: Normal heart sounds. No murmur heard. Pulmonary:     Effort: Pulmonary effort is normal.     Breath sounds: Normal breath sounds. No wheezing.  Abdominal:     General: Bowel sounds are normal.     Palpations: Abdomen is soft.     Tenderness: There is no abdominal tenderness. There is no right CVA tenderness or left CVA tenderness.  Musculoskeletal:        General: Normal range of motion.     Cervical back: Normal range of motion.     Right lower leg: No edema.     Left lower leg: No edema.  Skin:    General: Skin is warm and dry.  Neurological:     General: No focal deficit present.     Mental Status: She is alert and oriented to person, place, and time.  Psychiatric:        Mood and Affect: Mood normal.        Behavior: Behavior normal.      No results found for any visits on 05/10/24.  Recent Results (from the past 2160 hours)  CBC with Diff     Status: None   Collection Time:  04/25/24 11:44 AM  Result Value Ref Range   WBC 8.2 3.4 - 10.8 x10E3/uL   RBC 4.32 3.77 - 5.28 x10E6/uL   Hemoglobin 13.7 11.1 - 15.9 g/dL  Hematocrit 40.0 34.0 - 46.6 %   MCV 93 79 - 97 fL   MCH 31.7 26.6 - 33.0 pg   MCHC 34.3 31.5 - 35.7 g/dL   RDW 45.4 09.8 - 11.9 %   Platelets 273 150 - 450 x10E3/uL   Neutrophils 54 Not Estab. %   Lymphs 37 Not Estab. %   Monocytes 6 Not Estab. %   Eos 2 Not Estab. %   Basos 1 Not Estab. %   Neutrophils Absolute 4.4 1.4 - 7.0 x10E3/uL   Lymphocytes Absolute 3.1 0.7 - 3.1 x10E3/uL   Monocytes Absolute 0.5 0.1 - 0.9 x10E3/uL   EOS (ABSOLUTE) 0.2 0.0 - 0.4 x10E3/uL   Basophils Absolute 0.1 0.0 - 0.2 x10E3/uL   Immature Granulocytes 0 Not Estab. %   Immature Grans (Abs) 0.0 0.0 - 0.1 x10E3/uL  CMP14+EGFR     Status: None   Collection Time: 04/25/24 11:44 AM  Result Value Ref Range   Glucose 97 70 - 99 mg/dL   BUN 12 6 - 24 mg/dL   Creatinine, Ser 1.47 0.57 - 1.00 mg/dL   eGFR 829 >56 OZ/HYQ/6.57   BUN/Creatinine Ratio 17 9 - 23   Sodium 142 134 - 144 mmol/L   Potassium 4.6 3.5 - 5.2 mmol/L   Chloride 105 96 - 106 mmol/L   CO2 24 20 - 29 mmol/L   Calcium 9.2 8.7 - 10.2 mg/dL   Total Protein 6.8 6.0 - 8.5 g/dL   Albumin 4.4 3.9 - 4.9 g/dL   Globulin, Total 2.4 1.5 - 4.5 g/dL   Bilirubin Total 0.6 0.0 - 1.2 mg/dL   Alkaline Phosphatase 84 44 - 121 IU/L   AST 19 0 - 40 IU/L   ALT 23 0 - 32 IU/L  Lipid Panel w/o Chol/HDL Ratio     Status: Abnormal   Collection Time: 04/25/24 11:44 AM  Result Value Ref Range   Cholesterol, Total 204 (H) 100 - 199 mg/dL   Triglycerides 87 0 - 149 mg/dL   HDL 57 >84 mg/dL   VLDL Cholesterol Cal 16 5 - 40 mg/dL   LDL Chol Calc (NIH) 696 (H) 0 - 99 mg/dL  Hemoglobin E9B     Status: Abnormal   Collection Time: 04/25/24 11:44 AM  Result Value Ref Range   Hgb A1c MFr Bld 5.7 (H) 4.8 - 5.6 %    Comment:          Prediabetes: 5.7 - 6.4          Diabetes: >6.4          Glycemic control for adults with  diabetes: <7.0    Est. average glucose Bld gHb Est-mCnc 117 mg/dL  POCT Urinalysis Dipstick (28413)     Status: None   Collection Time: 04/25/24 12:35 PM  Result Value Ref Range   Color, UA Yellow    Clarity, UA Clear    Glucose, UA Negative Negative   Bilirubin, UA Negative    Ketones, UA Negative    Spec Grav, UA 1.020 1.010 - 1.025   Blood, UA Negative    pH, UA 7.0 5.0 - 8.0   Protein, UA Negative Negative   Urobilinogen, UA 0.2 0.2 or 1.0 E.U./dL   Nitrite, UA Negative    Leukocytes, UA Negative Negative   Appearance Clear    Odor No       Assessment & Plan:  Continue current medications.  Awaits psych consultation.  Monitor blood pressure and weight loss. Problem List  Items Addressed This Visit     Seasonal allergies   Primary insomnia   Relevant Medications   traZODone (DESYREL) 50 MG tablet   Reactive depression   Relevant Medications   traZODone (DESYREL) 50 MG tablet   Mixed hyperlipidemia - Primary   Other Visit Diagnoses       Prediabetes           Return in about 5 weeks (around 06/14/2024).   Total time spent: 30 minutes  Aisha Hove, MD  05/10/2024   This document may have been prepared by [facility] Voice Recognition software and as such may include unintentional dictation errors.

## 2024-05-14 ENCOUNTER — Encounter: Payer: Self-pay | Admitting: Internal Medicine

## 2024-06-27 ENCOUNTER — Encounter: Payer: Self-pay | Admitting: Internal Medicine

## 2024-06-27 ENCOUNTER — Ambulatory Visit: Admitting: Internal Medicine

## 2024-06-27 VITALS — BP 122/72 | HR 88 | Ht 64.0 in | Wt 167.6 lb

## 2024-06-27 DIAGNOSIS — I10 Essential (primary) hypertension: Secondary | ICD-10-CM

## 2024-06-27 DIAGNOSIS — L918 Other hypertrophic disorders of the skin: Secondary | ICD-10-CM | POA: Diagnosis not present

## 2024-06-27 DIAGNOSIS — R7303 Prediabetes: Secondary | ICD-10-CM | POA: Diagnosis not present

## 2024-06-27 DIAGNOSIS — E782 Mixed hyperlipidemia: Secondary | ICD-10-CM | POA: Diagnosis not present

## 2024-06-27 DIAGNOSIS — L409 Psoriasis, unspecified: Secondary | ICD-10-CM

## 2024-06-27 NOTE — Progress Notes (Signed)
 Established Patient Office Visit  Subjective:  Patient ID: Victoria Mason, female    DOB: 1981/11/04  Age: 43 y.o. MRN: [id number]  Chief Complaint  Patient presents with   Follow-up    5 week follow up    Patient comes in for her follow-up today.  She is generally feeling well and has lost weight on Zepbound.  She does started her Zepbound 7.5 mg/week.  Denies nausea or vomiting, no abdominal pain and no constipation.  She is also tolerating her well.  Very well.  However she still is waiting to see the psychiatry to be evaluated for bipolar disorder.  Denies any headaches or dizziness, no chest pain and no palpitations. She has multiple skin tags, would like a referral to a dermatologist for removal.    No other concerns at this time.   Past Medical History:  Diagnosis Date   Dysmenorrhea    History of hypertension    due to phentermine    Psoriasis    Seasonal allergies     Past Surgical History:  Procedure Laterality Date   CESAREAN SECTION  02/05/2005   CESAREAN SECTION  01/04/2008   CHOLECYSTECTOMY     DENTAL SURGERY     Dental implant    LAPAROSCOPY     REDUCTION MAMMAPLASTY Bilateral 2022   TONSILLECTOMY     TUBAL LIGATION  2009    Social History   Socioeconomic History   Marital status: Married    Spouse name: Not on file   Number of children: 3   Years of education: Not on file   Highest education level: Not on file  Occupational History   Not on file  Tobacco Use   Smoking status: Former   Smokeless tobacco: Never  Vaping Use   Vaping status: Never Used  Substance and Sexual Activity   Alcohol use: Yes    Comment: light   Drug use: No   Sexual activity: Yes    Birth control/protection: Surgical    Comment: Tubal ligation   Other Topics Concern   Not on file  Social History Narrative   Not on file   Social Drivers of Health   Financial Resource Strain: Not on file  Food Insecurity: Not on file  Transportation Needs: Not on file   Physical Activity: Not on file  Stress: Not on file  Social Connections: Not on file  Intimate Partner Violence: Not on file    Family History  Problem Relation Age of Onset   Ovarian cancer Maternal Aunt        55s   Cancer Neg Hx    Diabetes Neg Hx    Hyperlipidemia Neg Hx    Hypertension Neg Hx    Stroke Neg Hx    Breast cancer Neg Hx     Allergies  Allergen Reactions   Ace Inhibitors Anaphylaxis   Black Buyer, retail (Non-Screening) Anaphylaxis and Hives   Cat Dander    Morphine And Codeine    Pollen Extract Other (See Comments)    Nasal congestion    Outpatient Medications Prior to Visit  Medication Sig   buPROPion  (WELLBUTRIN  SR) 150 MG 12 hr tablet Take 1 tablet (150 mg total) by mouth 2 (two) times daily.   EPINEPHrine  0.3 mg/0.3 mL IJ SOAJ injection Inject 0.3 mg into the muscle as needed.   losartan -hydrochlorothiazide  (HYZAAR) 100-25 MG tablet Take 1 tablet by mouth daily.   ondansetron  (ZOFRAN ) 8 MG tablet TAKE ONE TABLET BY MOUTH EVERY 8  HOURS AS NEEDED FOR NAUSEA AND VOMITING   traZODone  (DESYREL ) 50 MG tablet Take 0.5 tablets (25 mg total) by mouth at bedtime.   ZEPBOUND 7.5 MG/0.5ML Pen Inject 7.5 mg into the skin once a week.   [DISCONTINUED] ZEPBOUND 2.5 MG/0.5ML Pen Inject 2.5 mg into the skin once a week. (Patient not taking: Reported on 06/27/2024)   No facility-administered medications prior to visit.    Review of Systems  Constitutional: Negative.  Negative for chills, diaphoresis, fever and malaise/fatigue.  HENT:  Negative for ear discharge and sore throat.   Eyes: Negative.   Respiratory: Negative.  Negative for cough and shortness of breath.   Cardiovascular: Negative.  Negative for chest pain, palpitations and leg swelling.  Gastrointestinal: Negative.  Negative for abdominal pain, constipation, diarrhea, heartburn, nausea and vomiting.  Genitourinary: Negative.  Negative for dysuria and flank pain.  Musculoskeletal: Negative.   Negative for joint pain and myalgias.  Skin: Negative.   Neurological: Negative.  Negative for dizziness, tingling, tremors and headaches.  Endo/Heme/Allergies: Negative.   Psychiatric/Behavioral: Negative.  Negative for depression and suicidal ideas. The patient is not nervous/anxious.        Objective:   BP 122/72   Pulse 88   Ht 5' 4 (1.626 m)   Wt 167 lb 9.6 oz (76 kg)   SpO2 96%   BMI 28.77 kg/m   Vitals:   06/27/24 1258  BP: 122/72  Pulse: 88  Height: 5' 4 (1.626 m)  Weight: 167 lb 9.6 oz (76 kg)  SpO2: 96%  BMI (Calculated): 28.75    Physical Exam Vitals and nursing note reviewed.  Constitutional:      Appearance: Normal appearance.  HENT:     Head: Normocephalic and atraumatic.     Nose: Nose normal.     Mouth/Throat:     Mouth: Mucous membranes are moist.     Pharynx: Oropharynx is clear.  Eyes:     Conjunctiva/sclera: Conjunctivae normal.     Pupils: Pupils are equal, round, and reactive to light.  Cardiovascular:     Rate and Rhythm: Normal rate and regular rhythm.     Pulses: Normal pulses.     Heart sounds: Normal heart sounds. No murmur heard. Pulmonary:     Effort: Pulmonary effort is normal.     Breath sounds: Normal breath sounds. No wheezing.  Abdominal:     General: Bowel sounds are normal.     Palpations: Abdomen is soft.     Tenderness: There is no abdominal tenderness. There is no right CVA tenderness or left CVA tenderness.  Musculoskeletal:        General: Normal range of motion.     Cervical back: Normal range of motion.     Right lower leg: No edema.     Left lower leg: No edema.  Skin:    General: Skin is warm and dry.  Neurological:     General: No focal deficit present.     Mental Status: She is alert and oriented to person, place, and time.  Psychiatric:        Mood and Affect: Mood normal.        Behavior: Behavior normal.      No results found for any visits on 06/27/24.  Recent Results (from the past 2160  hours)  CBC with Diff     Status: None   Collection Time: 04/25/24 11:44 AM  Result Value Ref Range   WBC 8.2 3.4 - 10.8 x10E3/uL  RBC 4.32 3.77 - 5.28 x10E6/uL   Hemoglobin 13.7 11.1 - 15.9 g/dL   Hematocrit 59.9 65.9 - 46.6 %   MCV 93 79 - 97 fL   MCH 31.7 26.6 - 33.0 pg   MCHC 34.3 31.5 - 35.7 g/dL   RDW 87.4 88.2 - 84.5 %   Platelets 273 150 - 450 x10E3/uL   Neutrophils 54 Not Estab. %   Lymphs 37 Not Estab. %   Monocytes 6 Not Estab. %   Eos 2 Not Estab. %   Basos 1 Not Estab. %   Neutrophils Absolute 4.4 1.4 - 7.0 x10E3/uL   Lymphocytes Absolute 3.1 0.7 - 3.1 x10E3/uL   Monocytes Absolute 0.5 0.1 - 0.9 x10E3/uL   EOS (ABSOLUTE) 0.2 0.0 - 0.4 x10E3/uL   Basophils Absolute 0.1 0.0 - 0.2 x10E3/uL   Immature Granulocytes 0 Not Estab. %   Immature Grans (Abs) 0.0 0.0 - 0.1 x10E3/uL  CMP14+EGFR     Status: None   Collection Time: 04/25/24 11:44 AM  Result Value Ref Range   Glucose 97 70 - 99 mg/dL   BUN 12 6 - 24 mg/dL   Creatinine, Ser 9.29 0.57 - 1.00 mg/dL   eGFR 890 >40 fO/fpw/8.26   BUN/Creatinine Ratio 17 9 - 23   Sodium 142 134 - 144 mmol/L   Potassium 4.6 3.5 - 5.2 mmol/L   Chloride 105 96 - 106 mmol/L   CO2 24 20 - 29 mmol/L   Calcium 9.2 8.7 - 10.2 mg/dL   Total Protein 6.8 6.0 - 8.5 g/dL   Albumin 4.4 3.9 - 4.9 g/dL   Globulin, Total 2.4 1.5 - 4.5 g/dL   Bilirubin Total 0.6 0.0 - 1.2 mg/dL   Alkaline Phosphatase 84 44 - 121 IU/L   AST 19 0 - 40 IU/L   ALT 23 0 - 32 IU/L  Lipid Panel w/o Chol/HDL Ratio     Status: Abnormal   Collection Time: 04/25/24 11:44 AM  Result Value Ref Range   Cholesterol, Total 204 (H) 100 - 199 mg/dL   Triglycerides 87 0 - 149 mg/dL   HDL 57 >60 mg/dL   VLDL Cholesterol Cal 16 5 - 40 mg/dL   LDL Chol Calc (NIH) 868 (H) 0 - 99 mg/dL  Hemoglobin J8r     Status: Abnormal   Collection Time: 04/25/24 11:44 AM  Result Value Ref Range   Hgb A1c MFr Bld 5.7 (H) 4.8 - 5.6 %    Comment:          Prediabetes: 5.7 - 6.4           Diabetes: >6.4          Glycemic control for adults with diabetes: <7.0    Est. average glucose Bld gHb Est-mCnc 117 mg/dL  POCT Urinalysis Dipstick (18997)     Status: None   Collection Time: 04/25/24 12:35 PM  Result Value Ref Range   Color, UA Yellow    Clarity, UA Clear    Glucose, UA Negative Negative   Bilirubin, UA Negative    Ketones, UA Negative    Spec Grav, UA 1.020 1.010 - 1.025   Blood, UA Negative    pH, UA 7.0 5.0 - 8.0   Protein, UA Negative Negative   Urobilinogen, UA 0.2 0.2 or 1.0 E.U./dL   Nitrite, UA Negative    Leukocytes, UA Negative Negative   Appearance Clear    Odor No       Assessment & Plan:  Continue all medications.  Referral sent to the dermatologist for skin tag removal. Problem List Items Addressed This Visit     Essential hypertension - Primary   Psoriasis and similar disorder   Mixed hyperlipidemia   Other Visit Diagnoses       Skin tags, multiple acquired       Relevant Orders   Ambulatory referral to Dermatology     Prediabetes           Return in about 2 months (around 08/28/2024).   Total time spent: 30 minutes  FERNAND FREDY RAMAN, MD  06/27/2024   This document may have been prepared by [facility] Voice Recognition software and as such may include unintentional dictation errors.

## 2024-08-06 ENCOUNTER — Ambulatory Visit (INDEPENDENT_AMBULATORY_CARE_PROVIDER_SITE_OTHER): Payer: Self-pay | Admitting: Psychiatry

## 2024-08-06 ENCOUNTER — Encounter: Payer: Self-pay | Admitting: Psychiatry

## 2024-08-06 VITALS — BP 118/78 | HR 90 | Temp 98.4°F | Ht 64.0 in | Wt 158.2 lb

## 2024-08-06 DIAGNOSIS — F3111 Bipolar disorder, current episode manic without psychotic features, mild: Secondary | ICD-10-CM | POA: Diagnosis not present

## 2024-08-06 MED ORDER — QUETIAPINE FUMARATE 50 MG PO TABS
50.0000 mg | ORAL_TABLET | Freq: Every day | ORAL | 0 refills | Status: DC
Start: 2024-08-06 — End: 2024-09-05

## 2024-08-06 NOTE — Progress Notes (Signed)
 Psychiatric Initial Adult Assessment   Patient Identification: Victoria Mason MRN:  [id number] Date of Evaluation:  08/06/2024 Referral Source: Dr. Deretha MD Chief Complaint:   Chief Complaint  Patient presents with   Establish Care   Visit Diagnosis:    ICD-10-CM   1. Bipolar 1 disorder, manic, mild (HCC)  F31.11       History of Present Illness: A 43 year old female presenting to [facility] for the establishing care.  Patient states that she has previously been diagnosed with bipolar more than 10 years ago and states she has been on medications but states that she stopped her medications several years ago and stated that this was due to her doctor disappearing and so she never got the refills renewed by another provider.  Patient reports previously she was on Lamictal for 10 years stating that she did not really notice a difference when she got off the medications but reports the only medication she is stayed on is Wellbutrin  150 mg SR twice a day for bipolar depression.  Patient reports that for the last several years that she has been noticing that her bipolar disorder has gotten out of control in which she is very impulsive, irritable, agitated, aggressive and financially extravagant.  Patient reports that she would like to try to get back on medications stating that one of her primary concerns at this time is that she is not able to sleep and takes trazodone  at this time 50 mg once daily before bed.  Patient reports that she has never been hospitalized and has been okay since but states that hypomanic like symptoms has been interfering with the relationship with her oldest son who is also having bipolar disorder and states that he had to be relocated due to the 2 having potentially fighting each other.  Patient reports that she does want to feel better about herself and states that she is looking for the medications to help her feel level in her head so that she could do well at work as well as at  home.  This on this assessment interview is recommended for the patient be diagnosed with bipolar mood disorder in which the patient will start on Seroquel  50 mg once daily for bipolar and sleep.  Patient will stop trazodone  50 mg.  Patient will also continue Wellbutrin  150 mg SR twice a day for bipolar depression.  After patient is stable on her sleep and her mood patient will explore lamotrigine being on boarded as a mood stabilizer.  Patient has also been attached to a therapist with Leldon [facility]umgart[facility] at [facility].  Patient is in agreement with treatment plan.  Patient with no other questions or concerns at this time.  Patient denies SI, HI, AVH.  Patient to follow up in 2 weeks.  Associated Signs/Symptoms: Depression Symptoms: Negative (Hypo) Manic Symptoms:  Distractibility, Elevated Mood, Financial Extravagance, Grandiosity, Impulsivity, Irritable Mood, Anxiety Symptoms: Negative Psychotic Symptoms: Negative PTSD Symptoms: Negative  Past Psychiatric History:  Previous Psych Hospitalizations: - Denies Outpatient treatment:  - Current PCP with Burnett Deretha MD Medications Current: - Seroquel  50 mg once daily before bed for bipolar disorder -Wellbutrin  150 mg SR twice daily for adjunct to bipolar depression Next Steps: - Patient to focus on sleep and manic-like behavior with Seroquel , we will on board Lamictal next Medication Trials: - Unable to recall any previous trials Suicide & Violence: - Denies SI, HI, AVH -No previous history of SI or HI Substance Use: - Marijuana use once weekly patient  advised to abstain from marijuana during medication management with this provider Psychotherapy: - Patient has been referred to [place] within her [facility]. Legal:   Previous Psychotropic Medications: Yes   Substance Abuse History in the last 12 months:  Yes.  Marijuana use once a week  Consequences of Substance Abuse: Medical Consequences:  Patient  endorses using marijuana once a week and has been educated on the psychoactive nature of marijuana and has been recommended not to use marijuana while  under medication management with this provider  Past Medical History:  Past Medical History:  Diagnosis Date   Dysmenorrhea    History of hypertension    due to phentermine    Psoriasis    Seasonal allergies     Past Surgical History:  Procedure Laterality Date   CESAREAN SECTION  02/05/2005   CESAREAN SECTION  01/04/2008   CHOLECYSTECTOMY     DENTAL SURGERY     Dental implant    LAPAROSCOPY     REDUCTION MAMMAPLASTY Bilateral 2022   TONSILLECTOMY     TUBAL LIGATION  2009    Family Psychiatric History: Paternal, throughout paternal family patient endorses substance use and psychiatric issues but none confirmed.  Maternal, known bipolar within the mother as well as maternal family.  Family History:  Family History  Problem Relation Age of Onset   Ovarian cancer Maternal Aunt        55s   Cancer Neg Hx    Diabetes Neg Hx    Hyperlipidemia Neg Hx    Hypertension Neg Hx    Stroke Neg Hx    Breast cancer Neg Hx     Social History:   Social History   Socioeconomic History   Marital status: Married    Spouse name: Not on file   Number of children: 3   Years of education: Not on file   Highest education level: Associate degree: occupational, Scientist, product/process development, or vocational program  Occupational History   Not on file  Tobacco Use   Smoking status: Former   Smokeless tobacco: Never  Advertising account planner   Vaping status: Never Used  Substance and Sexual Activity   Alcohol use: Not Currently    Comment: light   Drug use: Yes    Types: Marijuana    Comment: maybe 1-2 times a week   Sexual activity: Yes    Birth control/protection: Surgical    Comment: Tubal ligation   Other Topics Concern   Not on file  Social History Narrative   Not on file   Social Drivers of Health   Financial Resource Strain: Not on file  Food Insecurity:  Not on file  Transportation Needs: Not on file  Physical Activity: Not on file  Stress: Not on file  Social Connections: Not on file    Additional Social History: No additional  Allergies:   Allergies  Allergen Reactions   Ace Inhibitors Anaphylaxis   Black Buyer, retail (Non-Screening) Anaphylaxis and Hives   Cat Dander    Morphine And Codeine    Pollen Extract Other (See Comments)    Nasal congestion    Metabolic Disorder Labs: Lab Results  Component Value Date   HGBA1C 5.7 (H) 04/25/2024   No results found for: PROLACTIN Lab Results  Component Value Date   CHOL 204 (H) 04/25/2024   TRIG 87 04/25/2024   HDL 57 04/25/2024   CHOLHDL 3.7 01/29/2021   LDLCALC 131 (H) 04/25/2024   LDLCALC 88 01/29/2021   Lab Results  Component Value Date   TSH 0.88 01/29/2021    Therapeutic Level Labs: No results found for: LITHIUM No results found for: CBMZ No results found for: VALPROATE  Current Medications: Current Outpatient Medications  Medication Sig Dispense Refill   buPROPion  (WELLBUTRIN  SR) 150 MG 12 hr tablet Take 1 tablet (150 mg total) by mouth 2 (two) times daily. 180 tablet 1   EPINEPHrine  0.3 mg/0.3 mL IJ SOAJ injection Inject 0.3 mg into the muscle as needed. 1 each 3   losartan -hydrochlorothiazide  (HYZAAR) 100-25 MG tablet Take 1 tablet by mouth daily. 90 tablet 3   traZODone  (DESYREL ) 50 MG tablet Take 0.5 tablets (25 mg total) by mouth at bedtime. 30 tablet 2   WEGOVY 2.4 MG/0.75ML SOAJ SQ injection Inject 2.4 mg into the skin.     No current facility-administered medications for this visit.    Musculoskeletal: Strength & Muscle Tone: within normal limits Gait & Station: normal Patient leans: N/A  Psychiatric Specialty Exam: Review of Systems  Constitutional: Negative.   HENT: Negative.    Eyes: Negative.   Respiratory: Negative.    Cardiovascular: Negative.   Gastrointestinal: Negative.   Endocrine: Negative.   Genitourinary:  Negative.   Musculoskeletal: Negative.   Skin: Negative.   Allergic/Immunologic: Negative.   Neurological: Negative.   Hematological: Negative.   Psychiatric/Behavioral:  Positive for decreased concentration, dysphoric mood and sleep disturbance. The patient is nervous/anxious.     Blood pressure 118/78, pulse 90, temperature 98.4 F (36.9 C), temperature source Temporal, height 5' 4 (1.626 m), weight 158 lb 3.2 oz (71.8 kg), last menstrual period 07/23/2024, SpO2 100%.Body mass index is 27.15 kg/m.  General Appearance: Well Groomed  Eye Contact:  Good  Speech:  Clear and Coherent  Volume:  Normal  Mood:  Anxious and Irritable  Affect:  Full Range  Thought Process:  Coherent  Orientation:  Full (Time, Place, and Person)  Thought Content:  Logical  Suicidal Thoughts:  No  Homicidal Thoughts:  No  Memory:  Immediate;   Good Recent;   Good Remote;   Good  Judgement:  Fair  Insight:  Fair  Psychomotor Activity:  Normal  Concentration:  Concentration: Good and Attention Span: Good  Recall:  Good  Fund of Knowledge:Good  Language: Good  Akathisia:  No  Handed:  Right  AIMS (if indicated):    Assets:  Desire for Improvement Financial Resources/Insurance Housing Social Support Vocational/Educational  ADL's:  Intact  Cognition: WNL  Sleep:  Good   Screenings: GAD-7    Flowsheet Row Office Visit from 04/25/2024 in [facility]  Total GAD-7 Score 14   PHQ2-9    Flowsheet Row Office Visit from 04/25/2024 in [facility] Office Visit from 11/28/2022 in [facility] Office Visit from 04/18/2022 in [facility] Office Visit from 09/20/2021 in [facility]  PHQ-2 Total Score 4 0 0 4  PHQ-9 Total Score 18 3 -- 7   Flowsheet Row ED from 07/28/2023 in [facility] Emergency Department at [facility]  C-SSRS RISK CATEGORY No Risk    Assessment and Plan:  Assessment -  Diagnosis: Bipolar 1 disorder, manic, mild (HCC) [F31.11]   - Progress: Baseline  - Risk Factors: Worsening symptoms  Plan - Medications:  Stop Trazadone, due to change in medication Continue Wellbutrin  SR 150mg  BID for, adjunct for bipolar depression.  Start Seroquel  50mg  once daily before bed,  - Psychotherapy: Pt has been referred  to Agustina Bibb for therapy, Submitted by this provider.  - Education: Patient has been educated on how to reach this provider by calling the clinic or by utilizing my chart messaging.  Patient also educated on medications, purpose, dosage, side effects and adverse reactions. - Follow-Up: Patient to follow up in 2 weeks - Referrals: Patient has been provide referral for therapy. - Safety Planning:  The patient has been educated, if they should have suicidal thoughts with or without a plan to call 911, or go to the closest emergency department.  Pt verbalized understanding.  Pt denies firearms within the home.  Pt also agrees to call the clinic should they have worsening symptoms before the next appointment.     Patient/Guardian was advised Release of Information must be obtained prior to any record release in order to collaborate their care with an outside provider. Patient/Guardian was advised if they have not already done so to contact the registration department to sign all necessary forms in order for us  to release information regarding their care.   Consent: Patient/Guardian gives verbal consent for treatment and assignment of benefits for services provided during this visit. Patient/Guardian expressed understanding and agreed to proceed.   Dorn Jama Der, NP 8/12/20253:18 PM

## 2024-08-07 NOTE — Addendum Note (Signed)
 Addended by: Remy Dia on: 08/07/2024 04:43 PM   Modules accepted: Level of Service

## 2024-08-13 ENCOUNTER — Encounter: Payer: Self-pay | Admitting: Internal Medicine

## 2024-08-20 ENCOUNTER — Other Ambulatory Visit: Payer: Self-pay | Admitting: Internal Medicine

## 2024-08-20 DIAGNOSIS — R7303 Prediabetes: Secondary | ICD-10-CM

## 2024-08-20 DIAGNOSIS — E669 Obesity, unspecified: Secondary | ICD-10-CM

## 2024-08-20 MED ORDER — WEGOVY 2.4 MG/0.75ML ~~LOC~~ SOAJ
2.4000 mg | SUBCUTANEOUS | 3 refills | Status: AC
Start: 2024-08-20 — End: ?

## 2024-08-23 ENCOUNTER — Other Ambulatory Visit: Payer: Self-pay | Admitting: Psychiatry

## 2024-08-23 ENCOUNTER — Telehealth (INDEPENDENT_AMBULATORY_CARE_PROVIDER_SITE_OTHER): Payer: Self-pay | Admitting: Psychiatry

## 2024-08-23 DIAGNOSIS — F3111 Bipolar disorder, current episode manic without psychotic features, mild: Secondary | ICD-10-CM

## 2024-08-23 MED ORDER — LAMOTRIGINE 25 MG PO TABS: 25.0000 mg | ORAL_TABLET | Freq: Every day | ORAL | 0 refills | Status: DC

## 2024-08-23 NOTE — Progress Notes (Signed)
 No Show

## 2024-08-29 MED ORDER — ONDANSETRON HCL 4 MG PO TABS: 4.0000 mg | ORAL_TABLET | Freq: Three times a day (TID) | ORAL | 1 refills | Status: DC | PRN

## 2024-08-30 ENCOUNTER — Ambulatory Visit: Admitting: Internal Medicine

## 2024-08-30 ENCOUNTER — Telehealth: Admitting: Psychiatry

## 2024-09-05 ENCOUNTER — Other Ambulatory Visit: Payer: Self-pay | Admitting: Psychiatry

## 2024-09-05 DIAGNOSIS — F3111 Bipolar disorder, current episode manic without psychotic features, mild: Secondary | ICD-10-CM

## 2024-09-05 MED ORDER — QUETIAPINE FUMARATE 50 MG PO TABS
50.0000 mg | ORAL_TABLET | Freq: Every day | ORAL | 2 refills | Status: AC
Start: 2024-09-05 — End: ?

## 2024-09-05 MED ORDER — LAMOTRIGINE 25 MG PO TABS: 25.0000 mg | ORAL_TABLET | Freq: Every day | ORAL | 0 refills | Status: AC

## 2024-12-13 ENCOUNTER — Ambulatory Visit: Admitting: Internal Medicine

## 2024-12-13 ENCOUNTER — Encounter: Payer: Self-pay | Admitting: Internal Medicine

## 2024-12-13 VITALS — BP 110/76 | HR 95 | Ht 64.0 in | Wt 152.4 lb

## 2024-12-13 DIAGNOSIS — L409 Psoriasis, unspecified: Secondary | ICD-10-CM | POA: Diagnosis not present

## 2024-12-13 DIAGNOSIS — Z6826 Body mass index (BMI) 26.0-26.9, adult: Secondary | ICD-10-CM | POA: Diagnosis not present

## 2024-12-13 DIAGNOSIS — I10 Essential (primary) hypertension: Secondary | ICD-10-CM | POA: Diagnosis not present

## 2024-12-13 DIAGNOSIS — R110 Nausea: Secondary | ICD-10-CM | POA: Insufficient documentation

## 2024-12-13 DIAGNOSIS — R7303 Prediabetes: Secondary | ICD-10-CM | POA: Insufficient documentation

## 2024-12-13 DIAGNOSIS — E663 Overweight: Secondary | ICD-10-CM

## 2024-12-13 DIAGNOSIS — R5383 Other fatigue: Secondary | ICD-10-CM | POA: Diagnosis not present

## 2024-12-13 MED ORDER — ONDANSETRON HCL 4 MG PO TABS: ORAL_TABLET | ORAL | 2 refills | Status: AC

## 2024-12-13 NOTE — Progress Notes (Signed)
 "  Established Patient Office Visit  Subjective:  Patient ID: Victoria Mason, female    DOB: 1981-08-09  Age: 43 y.o. MRN: [id number]  Chief Complaint  Patient presents with   Nausea    Patient is here today for follow up. She reports feeling well today but has concerns to discuss.  She reports she missed 2 weeks of Wegovy  while she was traveling and restarted the injection once she returned. She has now completed 2 doses. Discussed going back down on the dose for a month and then going back up due to patient reporting that she is having difficulty with nausea and has run out of her Zofran . Will send Zofran  refill today. She states she would rather not go down on the dose as she is not having any other symptoms with the Wegovy . She reports that the nausea is worse the first few days after he injection and then subsides. Patient weight 04/2024 was 177 and she is down to 152 lbs today. Reinforced healthy diet and exercise as tolerated.  Patient reports psychiatrist discontinued trazodone  and switched her to Seroquel  at bedtime. She states she feels as it is helping.  Updated PHQ-9 score 3 GAD score 1 today.     No other concerns at this time.   Past Medical History:  Diagnosis Date   Dysmenorrhea    History of hypertension    due to phentermine    Psoriasis    Seasonal allergies     Past Surgical History:  Procedure Laterality Date   CESAREAN SECTION  02/05/2005   CESAREAN SECTION  01/04/2008   CHOLECYSTECTOMY     DENTAL SURGERY     Dental implant    LAPAROSCOPY     REDUCTION MAMMAPLASTY Bilateral 2022   TONSILLECTOMY     TUBAL LIGATION  2009    Social History   Socioeconomic History   Marital status: Married    Spouse name: Not on file   Number of children: 3   Years of education: Not on file   Highest education level: Associate degree: occupational, scientist, product/process development, or vocational program  Occupational History   Not on file  Tobacco Use   Smoking status: Former   Smokeless  tobacco: Never  Vaping Use   Vaping status: Never Used  Substance and Sexual Activity   Alcohol use: Not Currently    Comment: light   Drug use: Yes    Types: Marijuana    Comment: maybe 1-2 times a week   Sexual activity: Yes    Birth control/protection: Surgical    Comment: Tubal ligation   Other Topics Concern   Not on file  Social History Narrative   Not on file   Social Drivers of Health   Tobacco Use: Medium Risk (12/13/2024)   Patient History    Smoking Tobacco Use: Former    Smokeless Tobacco Use: Never    Passive Exposure: Not on Actuary Strain: Not on file  Food Insecurity: Not on file  Transportation Needs: Not on file  Physical Activity: Not on file  Stress: Not on file  Social Connections: Not on file  Intimate Partner Violence: Not on file  Depression (PHQ2-9): Low Risk (12/13/2024)   Depression (PHQ2-9)    PHQ-2 Score: 3  Alcohol Screen: Not on file  Housing: Not on file  Utilities: Not on file  Health Literacy: Not on file    Family History  Problem Relation Age of Onset   Ovarian cancer Maternal Aunt  50s   Cancer Neg Hx    Diabetes Neg Hx    Hyperlipidemia Neg Hx    Hypertension Neg Hx    Stroke Neg Hx    Breast cancer Neg Hx     Allergies[1]  Show/hide medication list[2]  Review of Systems  Constitutional: Negative.  Negative for chills, fever and malaise/fatigue.  HENT: Negative.  Negative for congestion and sore throat.   Eyes: Negative.  Negative for blurred vision and pain.  Respiratory: Negative.  Negative for cough and shortness of breath.   Cardiovascular: Negative.  Negative for chest pain, palpitations and leg swelling.  Gastrointestinal:  Positive for nausea. Negative for abdominal pain, blood in stool, constipation, diarrhea, heartburn, melena and vomiting.  Genitourinary: Negative.  Negative for dysuria, flank pain, frequency and urgency.  Musculoskeletal: Negative.  Negative for joint pain and  myalgias.  Skin: Negative.   Neurological: Negative.  Negative for dizziness, tingling, sensory change, weakness and headaches.  Endo/Heme/Allergies: Negative.   Psychiatric/Behavioral: Negative.  Negative for depression and suicidal ideas. The patient is not nervous/anxious.        Objective:   BP 110/76   Pulse 95   Ht 5' 4 (1.626 m)   Wt 152 lb 6.4 oz (69.1 kg)   SpO2 96%   BMI 26.16 kg/m   Vitals:   12/13/24 1114  BP: 110/76  Pulse: 95  Height: 5' 4 (1.626 m)  Weight: 152 lb 6.4 oz (69.1 kg)  SpO2: 96%  BMI (Calculated): 26.15    Physical Exam Vitals and nursing note reviewed.  Constitutional:      Appearance: Normal appearance.  HENT:     Head: Normocephalic and atraumatic.     Nose: Nose normal.     Mouth/Throat:     Mouth: Mucous membranes are moist.     Pharynx: Oropharynx is clear.  Eyes:     Conjunctiva/sclera: Conjunctivae normal.     Pupils: Pupils are equal, round, and reactive to light.  Cardiovascular:     Rate and Rhythm: Normal rate and regular rhythm.     Pulses: Normal pulses.     Heart sounds: Normal heart sounds. No murmur heard. Pulmonary:     Effort: Pulmonary effort is normal.     Breath sounds: Normal breath sounds. No wheezing.  Abdominal:     General: Bowel sounds are normal.     Palpations: Abdomen is soft.     Tenderness: There is no abdominal tenderness. There is no right CVA tenderness or left CVA tenderness.  Musculoskeletal:        General: Normal range of motion.     Cervical back: Normal range of motion.     Right lower leg: No edema.     Left lower leg: No edema.  Skin:    General: Skin is warm and dry.  Neurological:     General: No focal deficit present.     Mental Status: She is alert and oriented to person, place, and time.  Psychiatric:        Mood and Affect: Mood normal.        Behavior: Behavior normal.      No results found for any visits on 12/13/24.  No results found for this or any previous visit  (from the past 2160 hours).    Assessment & Plan:  Take Zofran  as needed for nausea. Continue medications as prescribed. Check routine blood work today and FU with patient on results.  Problem List Items Addressed This Visit  Cardiovascular and Mediastinum   Essential hypertension   Relevant Orders   CMP14+EGFR   Lipid Panel w/o Chol/HDL Ratio   CBC with Diff     Musculoskeletal and Integument   Psoriasis and similar disorder   Relevant Orders   CBC with Diff     Other   Nausea - Primary   Relevant Medications   ondansetron  (ZOFRAN ) 4 MG tablet   Prediabetes   Relevant Orders   CMP14+EGFR   Lipid Panel w/o Chol/HDL Ratio   Hemoglobin A1c   Fatigue   Relevant Orders   Vitamin D (25 hydroxy)   Vitamin B12   Overweight with body mass index (BMI) of 26 to 26.9 in adult    Return in about 1 month (around 01/13/2025).   Total time spent: 25 minutes. This time includes review of previous notes and results and patient face to face interaction during today's visit.    FERNAND FREDY RAMAN, MD  12/13/2024   This document may have been prepared by [facility] Voice Recognition software and as such may include unintentional dictation errors.     [1]  Allergies Allergen Reactions   Ace Inhibitors Anaphylaxis   Black Buyer, Retail (Non-Screening) Anaphylaxis and Hives   Cat Dander    Morphine And Codeine    Pollen Extract Other (See Comments)    Nasal congestion  [2]  Outpatient Medications Prior to Visit  Medication Sig   buPROPion  (WELLBUTRIN  SR) 150 MG 12 hr tablet Take 1 tablet (150 mg total) by mouth 2 (two) times daily.   EPINEPHrine  0.3 mg/0.3 mL IJ SOAJ injection Inject 0.3 mg into the muscle as needed.   lamoTRIgine  (LAMICTAL ) 25 MG tablet Take 1 tablet (25 mg total) by mouth daily.   losartan -hydrochlorothiazide  (HYZAAR) 100-25 MG tablet Take 1 tablet by mouth daily.   QUEtiapine  (SEROQUEL ) 50 MG tablet Take 1 tablet (50 mg total) by mouth at bedtime.    WEGOVY  2.4 MG/0.75ML SOAJ SQ injection Inject 2.4 mg into the skin once a week.   [DISCONTINUED] ondansetron  (ZOFRAN ) 4 MG tablet Take 1 tablet (4 mg total) by mouth every 8 (eight) hours as needed for nausea or vomiting. (Patient not taking: Reported on 12/13/2024)   No facility-administered medications prior to visit.   "

## 2024-12-14 LAB — CMP14+EGFR
ALT: 9 IU/L (ref 0–32)
AST: 12 IU/L (ref 0–40)
Albumin: 4.6 g/dL (ref 3.9–4.9)
Alkaline Phosphatase: 67 IU/L (ref 41–116)
BUN/Creatinine Ratio: 14 (ref 9–23)
BUN: 13 mg/dL (ref 6–24)
Bilirubin Total: 0.8 mg/dL (ref 0.0–1.2)
CO2: 25 mmol/L (ref 20–29)
Calcium: 9.9 mg/dL (ref 8.7–10.2)
Chloride: 100 mmol/L (ref 96–106)
Creatinine, Ser: 0.94 mg/dL (ref 0.57–1.00)
Globulin, Total: 2.3 g/dL (ref 1.5–4.5)
Glucose: 92 mg/dL (ref 70–99)
Potassium: 3.9 mmol/L (ref 3.5–5.2)
Sodium: 140 mmol/L (ref 134–144)
Total Protein: 6.9 g/dL (ref 6.0–8.5)
eGFR: 76 mL/min/1.73

## 2024-12-14 LAB — CBC WITH DIFFERENTIAL/PLATELET
Basophils Absolute: 0.1 x10E3/uL (ref 0.0–0.2)
Basos: 1 %
EOS (ABSOLUTE): 0.4 x10E3/uL (ref 0.0–0.4)
Eos: 6 %
Hematocrit: 38.9 % (ref 34.0–46.6)
Hemoglobin: 13.1 g/dL (ref 11.1–15.9)
Immature Grans (Abs): 0 x10E3/uL (ref 0.0–0.1)
Immature Granulocytes: 0 %
Lymphocytes Absolute: 2.1 x10E3/uL (ref 0.7–3.1)
Lymphs: 31 %
MCH: 31.8 pg (ref 26.6–33.0)
MCHC: 33.7 g/dL (ref 31.5–35.7)
MCV: 94 fL (ref 79–97)
Monocytes Absolute: 0.5 x10E3/uL (ref 0.1–0.9)
Monocytes: 7 %
Neutrophils Absolute: 3.8 x10E3/uL (ref 1.4–7.0)
Neutrophils: 55 %
Platelets: 299 x10E3/uL (ref 150–450)
RBC: 4.12 x10E6/uL (ref 3.77–5.28)
RDW: 13 % (ref 11.7–15.4)
WBC: 6.8 x10E3/uL (ref 3.4–10.8)

## 2024-12-14 LAB — HEMOGLOBIN A1C
Est. average glucose Bld gHb Est-mCnc: 103 mg/dL
Hgb A1c MFr Bld: 5.2 % (ref 4.8–5.6)

## 2024-12-14 LAB — VITAMIN D 25 HYDROXY (VIT D DEFICIENCY, FRACTURES): Vit D, 25-Hydroxy: 32.1 ng/mL (ref 30.0–100.0)

## 2024-12-14 LAB — LIPID PANEL W/O CHOL/HDL RATIO
Cholesterol, Total: 155 mg/dL (ref 100–199)
HDL: 50 mg/dL
LDL Chol Calc (NIH): 94 mg/dL (ref 0–99)
Triglycerides: 52 mg/dL (ref 0–149)
VLDL Cholesterol Cal: 11 mg/dL (ref 5–40)

## 2024-12-14 LAB — VITAMIN B12: Vitamin B-12: 470 pg/mL (ref 232–1245)

## 2024-12-16 ENCOUNTER — Telehealth: Admitting: Physician Assistant

## 2024-12-16 ENCOUNTER — Ambulatory Visit: Payer: Self-pay | Admitting: Internal Medicine

## 2024-12-16 DIAGNOSIS — J029 Acute pharyngitis, unspecified: Secondary | ICD-10-CM | POA: Diagnosis not present

## 2024-12-16 NOTE — Progress Notes (Signed)
 We are sorry that you are not feeling well.  Here is how we plan to help!  Your symptoms indicate a likely viral infection (Pharyngitis).   Pharyngitis is inflammation in the back of the throat which can cause a sore throat, scratchiness and sometimes difficulty swallowing.   Pharyngitis is typically caused by a respiratory virus and will just run its course.  Please keep in mind that your symptoms could last up to 10 days.    For throat pain, we recommend over the counter oral pain relief medications such as acetaminophen  or aspirin, or anti-inflammatory medications such as ibuprofen  or naproxen sodium.  Topical treatments such as oral throat lozenges or sprays may be used as needed.   Avoid close contact with loved ones, especially the very young and elderly.  Remember to wash your hands thoroughly throughout the day as this is the number one way to prevent the spread of infection! We also recommend that you periodically wipe down door knobs and counters with disinfectant.  After careful review of your answers, I would not recommend and antibiotic for your condition.  Antibiotics should not be used to treat conditions that we suspect are caused by viruses like the virus that causes the common cold or flu.  Providers prescribe antibiotics to treat infections caused by bacteria. Antibiotics are very powerful in treating bacterial infections when they are used properly.  To maintain their effectiveness, they should be used only when necessary.  Overuse of antibiotics has resulted in the development of super bugs that are resistant to treatment!    Some people with strep throat, however, can have atypical symptoms. As such, if anything continued to progress despite treatment recommendations, you may need formal testing in clinic or office.  Home Care: Only take medications as instructed by your medical team. Do not drink alcohol while taking these medications. A steam or ultrasonic humidifier can help  congestion.  You can place a towel over your head and breathe in the steam from hot water coming from a faucet. Avoid close contacts especially the very young and the elderly. Cover your mouth when you cough or sneeze. Always remember to wash your hands.  Get Help Right Away If: You develop worsening fever or throat pain. You develop a severe head ache or visual changes. Your symptoms persist after you have completed your treatment plan.  Make sure you Understand these instructions. Will watch your condition. Will get help right away if you are not doing well or get worse.  Your e-visit answers were reviewed by a board certified advanced clinical practitioner to complete your personal care plan.  Depending on the condition, your plan could have included both over the counter or prescription medications.  If there is a problem please reply once you have received a response from your provider.  Your safety is important to us .  If you have drug allergies check your prescription carefully.    You can use MyChart to ask questions about todays visit, request a non-urgent call back, or ask for a work or school excuse for 24 hours related to this e-Visit. If it has been greater than 24 hours you will need to follow up with your provider, or enter a new e-Visit to address those concerns.  You will get an e-mail in the next two days asking about your experience.  I hope that your e-visit has been valuable and will speed your recovery. Thank you for using e-visits.   I have spent 5 minutes  in review of e-visit questionnaire, review and updating patient chart, medical decision making and response to patient.   Elsie Velma Lunger, PA-C

## 2024-12-24 ENCOUNTER — Telehealth: Payer: Self-pay

## 2024-12-24 ENCOUNTER — Other Ambulatory Visit: Payer: Self-pay | Admitting: Internal Medicine

## 2024-12-24 DIAGNOSIS — F329 Major depressive disorder, single episode, unspecified: Secondary | ICD-10-CM

## 2024-12-24 NOTE — Telephone Encounter (Signed)
 Pharmacy sent Refill fax for lamoTRIgine  (LAMICTAL ) 25 MG tablet  [facility] [id number] - [place], [place] - [address]    Last seen 08/30/24 Next apt on None on file

## 2025-01-13 ENCOUNTER — Telehealth: Payer: Self-pay | Admitting: Internal Medicine

## 2025-01-13 NOTE — Telephone Encounter (Signed)
 Per [name] the patient was informed that the PA was approved.

## 2025-01-24 ENCOUNTER — Ambulatory Visit: Admitting: Internal Medicine

## 2025-01-28 ENCOUNTER — Ambulatory Visit
